# Patient Record
Sex: Female | Born: 1960 | Race: White | Hispanic: No | Marital: Single | Smoking: Never smoker
Health system: Southern US, Community
[De-identification: ages and names within clinical notes are randomized; demographics above are authoritative.]

## PROBLEM LIST (undated history)

## (undated) DIAGNOSIS — G4700 Insomnia, unspecified: Secondary | ICD-10-CM

## (undated) DIAGNOSIS — R42 Dizziness and giddiness: Secondary | ICD-10-CM

## (undated) DIAGNOSIS — K819 Cholecystitis, unspecified: Secondary | ICD-10-CM

## (undated) DIAGNOSIS — I10 Essential (primary) hypertension: Secondary | ICD-10-CM

## (undated) DIAGNOSIS — E785 Hyperlipidemia, unspecified: Secondary | ICD-10-CM

## (undated) DIAGNOSIS — I2489 Other forms of acute ischemic heart disease: Secondary | ICD-10-CM

## (undated) DIAGNOSIS — F32A Depression, unspecified: Secondary | ICD-10-CM

## (undated) DIAGNOSIS — F419 Anxiety disorder, unspecified: Secondary | ICD-10-CM

## (undated) DIAGNOSIS — J45909 Unspecified asthma, uncomplicated: Secondary | ICD-10-CM

## (undated) DIAGNOSIS — F329 Major depressive disorder, single episode, unspecified: Secondary | ICD-10-CM

## (undated) DIAGNOSIS — K219 Gastro-esophageal reflux disease without esophagitis: Secondary | ICD-10-CM

## (undated) HISTORY — DX: Hyperlipidemia, unspecified: E78.5

## (undated) HISTORY — DX: Dizziness and giddiness: R42

## (undated) HISTORY — PX: BACK SURGERY: SHX140

## (undated) HISTORY — DX: Gastro-esophageal reflux disease without esophagitis: K21.9

## (undated) HISTORY — DX: Anxiety disorder, unspecified: F41.9

## (undated) HISTORY — DX: Insomnia, unspecified: G47.00

## (undated) HISTORY — PX: ABDOMINAL HYSTERECTOMY: SHX81

## (undated) HISTORY — DX: Essential (primary) hypertension: I10

## (undated) HISTORY — DX: Major depressive disorder, single episode, unspecified: F32.9

## (undated) HISTORY — DX: Depression, unspecified: F32.A

## (undated) HISTORY — DX: Other forms of acute ischemic heart disease: I24.89

## (undated) HISTORY — DX: Cholecystitis, unspecified: K81.9

---

## 2005-03-08 ENCOUNTER — Emergency Department: Payer: Self-pay | Admitting: Emergency Medicine

## 2005-03-09 ENCOUNTER — Emergency Department: Payer: Self-pay | Admitting: Emergency Medicine

## 2005-03-10 ENCOUNTER — Emergency Department: Payer: Self-pay | Admitting: Emergency Medicine

## 2005-05-05 ENCOUNTER — Emergency Department: Payer: Self-pay | Admitting: Emergency Medicine

## 2007-02-18 ENCOUNTER — Ambulatory Visit: Payer: Self-pay | Admitting: Family Medicine

## 2007-05-26 ENCOUNTER — Ambulatory Visit: Payer: Self-pay

## 2007-06-10 ENCOUNTER — Ambulatory Visit: Payer: Self-pay | Admitting: Internal Medicine

## 2007-06-13 ENCOUNTER — Ambulatory Visit: Payer: Self-pay | Admitting: Internal Medicine

## 2007-06-13 ENCOUNTER — Emergency Department: Payer: Self-pay | Admitting: Emergency Medicine

## 2011-07-27 ENCOUNTER — Emergency Department: Payer: Self-pay | Admitting: Emergency Medicine

## 2012-04-01 ENCOUNTER — Ambulatory Visit: Payer: Self-pay | Admitting: Family Medicine

## 2015-06-20 ENCOUNTER — Ambulatory Visit (INDEPENDENT_AMBULATORY_CARE_PROVIDER_SITE_OTHER): Payer: No Typology Code available for payment source

## 2015-06-20 ENCOUNTER — Ambulatory Visit
Admission: EM | Admit: 2015-06-20 | Discharge: 2015-06-20 | Disposition: A | Discharge status: Home / Self Care | Payer: No Typology Code available for payment source | Attending: Family Medicine | Admitting: Family Medicine

## 2015-06-20 ENCOUNTER — Encounter: Payer: Self-pay | Admitting: *Deleted

## 2015-06-20 DIAGNOSIS — J40 Bronchitis, not specified as acute or chronic: Secondary | ICD-10-CM

## 2015-06-20 DIAGNOSIS — J0100 Acute maxillary sinusitis, unspecified: Secondary | ICD-10-CM | POA: Diagnosis not present

## 2015-06-20 HISTORY — DX: Unspecified asthma, uncomplicated: J45.909

## 2015-06-20 MED ORDER — IPRATROPIUM-ALBUTEROL 0.5-2.5 (3) MG/3ML IN SOLN: 3.0000 mL | Freq: Four times a day (QID) | RESPIRATORY_TRACT | Status: DC

## 2015-06-20 MED ORDER — PREDNISONE 10 MG PO TABS: ORAL_TABLET | ORAL | Status: DC

## 2015-06-20 MED ORDER — AMOXICILLIN-POT CLAVULANATE 875-125 MG PO TABS: 1.0000 | ORAL_TABLET | Freq: Two times a day (BID) | ORAL | Status: DC

## 2015-06-20 MED ORDER — PREDNISONE 50 MG PO TABS: 60.0000 mg | ORAL_TABLET | Freq: Every day | ORAL | Status: DC

## 2015-06-20 MED ORDER — GUAIFENESIN-CODEINE 100-10 MG/5ML PO SOLN
5.0000 mL | Freq: Three times a day (TID) | ORAL | Status: DC | PRN
Start: 2015-06-20 — End: 2019-01-19

## 2015-06-20 NOTE — ED Provider Notes (Signed)
[facility]  ____________________________________________  Time seen: Approximately 1840 PM  I have reviewed the triage vital signs and the nursing notes.   HISTORY   Chief Complaint Cough and Wheezing     HPI Denise Huffman is a 54 y.o. femalepresents for the complaints of 1-1.5 weeks of runny nose, nasal congestion, sinus pressure and cough. States in the last 2-3 days she's began to have some intermittent wheezing with her cough. Does report that she has a history of asthma as well as bronchitis has a history of wheezing when sick. Denies chest pain or shortness of breath. States has used home albuterol inhaler which has helped with wheezing. But states that she has continued with cough. States frequently producing thick green drainage from nose.   States current sinus pressure pain is described as a pressure aching sensation at 5 out of 10. Denies neck or back pain. Denies chest pain, shortness of breath, fever, abdominal pain, dysuria, weakness, dizziness, leg swelling, calf pain or vision changes. Reports continues to eat and drink well. Denies known sick contacts.       Past Medical History  Diagnosis Date  . Asthma    PCP: L.Miles  There are no active problems to display for this patient.   Past Surgical History  Procedure Laterality Date  . Abdominal hysterectomy    . Back surgery      Current Outpatient Rx  Name  Route  Sig  Dispense  Refill  . albuterol (PROVENTIL HFA;VENTOLIN HFA) 108 (90 BASE) MCG/ACT inhaler   Inhalation   Inhale into the lungs every 6 (six) hours as needed for wheezing or shortness of breath.         . beclomethasone (QVAR) 80 MCG/ACT inhaler   Inhalation   Inhale into the lungs 2 (two) times daily.         . sertraline (ZOLOFT) 100 MG tablet   Oral   Take 100 mg by mouth 2 (two) times daily.         . traZODone (DESYREL) 100 MG tablet   Oral   Take 100 mg by mouth at bedtime.           Allergies Review of patient's  allergies indicates no known allergies.  No family history on file.  Social History Social History  Substance Use Topics  . Smoking status: Never Smoker   . Smokeless tobacco: None  . Alcohol Use: No    Review of Systems Constitutional: No fever/chills Eyes: No visual changes. ENT: No sore throat. Positive runny nose, congestion, sinus drainage, sinus pressure and cough.  Cardiovascular: Denies chest pain. Respiratory: Denies shortness of breath.  Gastrointestinal: No abdominal pain.  No nausea, no vomiting.  No diarrhea.  No constipation. Genitourinary: Negative for dysuria. Musculoskeletal: Negative for back pain. Skin: Negative for rash. Neurological: Negative for headaches, focal weakness or numbness.  10-point ROS otherwise negative.  ____________________________________________   PHYSICAL EXAM:  VITAL SIGNS: ED Triage Vitals  Enc Vitals Group     BP 06/20/15 1811 135/79 mmHg     Pulse Rate 06/20/15 1811 73     Resp -- 20     Temp 06/20/15 1811 98 F (36.7 C)     Temp Source 06/20/15 1811 Oral     SpO2 06/20/15 1811 96 %     Weight 06/20/15 1811 200 lb (90.719 kg)     Height 06/20/15 1811  (1.676 m)     Head Cir --  Peak Flow --      Pain Score 06/20/15 1819 8     Pain Loc --      Pain Edu? --      Excl. in GC? --     Constitutional: Alert and oriented. Well appearing and in no acute distress. Eyes: Conjunctivae are normal. PERRL. EOMI. Head: Atraumatic. Mod TTP bilateral maxillary sinuses, minimal TTP bilateral frontal sinuses. No swelling, no erythema.   Ears: no erythema, normal TMs bilaterally.   Nose: nasal congestion, bilateral nasal turbinate erythema, nares patent.  Mouth/Throat: Mucous membranes are moist.  Oropharynx non-erythematous. No tonsillar swelling or exudate.  Neck: No stridor.  No cervical spine tenderness to palpation. Hematological/Lymphatic/Immunilogical: No cervical lymphadenopathy. Cardiovascular: Normal rate, regular  rhythm. Grossly normal heart sounds.  Good peripheral circulation. Respiratory: Normal respiratory effort.  No retractions. Mild scattered lower base bilateral rhonchi, scattered inspiratory wheezes present, good air movement.  Gastrointestinal: Soft and nontender. No distention. Normal Bowel sounds. No CVA tenderness. Musculoskeletal: No lower or upper extremity tenderness nor edema.  No joint effusions. Bilateral pedal pulses equal and easily palpated.  Neurologic:  Normal speech and language. No gross focal neurologic deficits are appreciated. No gait instability. Skin:  Skin is warm, dry and intact. No rash noted. Psychiatric: Mood and affect are normal. Speech and behavior are normal.  ____________________________________________   LABS (all labs ordered are listed, but only abnormal results are displayed)  Labs Reviewed - No data to display  RADIOLOGY  EXAM: CHEST - 2 VIEW  COMPARISON: None available  FINDINGS: Mild interstitial prominence. No confluent airspace infiltrate or overt edema. Heart size upper limits normal. Fifth tortuous thoracic aorta. No pneumothorax. No effusion. Visualized skeletal structures are unremarkable.  IMPRESSION: No acute cardiopulmonary disease.   Electronically Signed By: Corlis Leak Hassell M.D. On: 06/20/2015 19:18  I, Renford DillsLindsey Mailynn Everly, personally viewed and evaluated these images (plain radiographs) as part of my medical decision making.     INITIAL IMPRESSION / ASSESSMENT AND PLAN / ED COURSE  Pertinent labs & imaging results that were available during my care of the patient were reviewed by me and considered in my medical decision making (see chart for details).  Very well-appearing patient. No acute distress. Presents for the complaints of 1-1.5 weeks of runny nose, nasal congestion, sinus pressure and intermittent cough. Reports gradual onset of intermittent wheezing with cough. Denies chest pain or shortness of breath. Afebrile.  Reports continues to eat and drink well. Scattered rhonchi as well as for next 3 wheezes noted. Albuterol ipratropium nebulizer 2. Oral prednisone 60 mg orally 1 in urgent care. Will evaluate chest x-ray.  Chest x-ray negative for acute cardiopulmonary disease. Post albuterol nebulizer treatments lungs reexamined, wheezes resolved, continues with mild scattered rhonchi. Patient reports feeling much better and requests ready to go home. Will treat with oral Augmentin, prednisone taper, when necessary guaifenesin codeine, rest, fluids and close follow-up with primary care physician.  Discussed follow up with Primary care physician this week. Discussed follow up and return parameters including no resolution or any worsening concerns. Patient verbalized understanding and agreed to plan.  ____________________________________________   FINAL CLINICAL IMPRESSION(S) / ED DIAGNOSES  Final diagnoses:  Bronchitis  Acute maxillary sinusitis, recurrence not specified       Renford DillsLindsey Jaiden Wahab, NP 06/20/15 2238  Renford DillsLindsey Reznor Ferrando, NP 06/21/15 1433

## 2015-06-20 NOTE — Discharge Instructions (Signed)
Take medication as prescribed. Rest. Eat and drink regularly. Use home albuterol inhaler as needed for wheezing.  Follow-up closely with your primary care physician. Return to urgent care or proceed to emergency room as needed for shortness of breath, chest pain, new or worsening concerns.  Sinusitis, Adult Sinusitis is redness, soreness, and puffiness (inflammation) of the air pockets in the bones of your face (sinuses). The redness, soreness, and puffiness can cause air and mucus to get trapped in your sinuses. This can allow germs to grow and cause an infection.  HOME CARE   Drink enough fluids to keep your pee (urine) clear or pale yellow.  Use a humidifier in your home.  Run a hot shower to create steam in the bathroom. Sit in the bathroom with the door closed. Breathe in the steam 3-4 times a day.  Put a warm, moist washcloth on your face 3-4 times a day, or as told by your doctor.  Use salt water sprays (saline sprays) to wet the thick fluid in your nose. This can help the sinuses drain.  Only take medicine as told by your doctor. GET HELP RIGHT AWAY IF:   Your pain gets worse.  You have very bad headaches.  You are sick to your stomach (nauseous).  You throw up (vomit).  You are very sleepy (drowsy) all the time.  Your face is puffy (swollen).  Your vision changes.  You have a stiff neck.  You have trouble breathing. MAKE SURE YOU:   Understand these instructions.  Will watch your condition.  Will get help right away if you are not doing well or get worse.   This information is not intended to replace advice given to you by your health care provider. Make sure you discuss any questions you have with your health care provider.   Document Released: 01/01/2008 Document Revised: 08/05/2014 Document Reviewed: 02/18/2012 Elsevier Interactive Patient Education [employer]2016 [employer]vier Inc.

## 2015-06-20 NOTE — ED Notes (Signed)
Pt states that she has had a cough and congestion, pt states that she has a "chest cold" since about a week and a half.

## 2018-03-26 ENCOUNTER — Encounter (INDEPENDENT_AMBULATORY_CARE_PROVIDER_SITE_OTHER): Payer: BLUE CROSS/BLUE SHIELD | Admitting: Vascular Surgery

## 2018-04-01 ENCOUNTER — Ambulatory Visit (INDEPENDENT_AMBULATORY_CARE_PROVIDER_SITE_OTHER): Payer: BLUE CROSS/BLUE SHIELD | Admitting: Vascular Surgery

## 2018-04-01 ENCOUNTER — Encounter (INDEPENDENT_AMBULATORY_CARE_PROVIDER_SITE_OTHER): Payer: Self-pay | Admitting: Vascular Surgery

## 2018-04-01 VITALS — BP 158/98 | HR 69 | Resp 13 | Ht 67.0 in | Wt 187.0 lb

## 2018-04-01 DIAGNOSIS — M79605 Pain in left leg: Secondary | ICD-10-CM | POA: Diagnosis not present

## 2018-04-01 DIAGNOSIS — M79604 Pain in right leg: Secondary | ICD-10-CM

## 2018-04-01 DIAGNOSIS — R600 Localized edema: Secondary | ICD-10-CM

## 2018-04-01 NOTE — Progress Notes (Signed)
Subjective:    Patient ID: Denise Huffman, female    DOB: 04/13/1961, 57 y.o.   MRN: [id number] Chief Complaint  Patient presents with  . New Patient (Initial Visit)    Lower leg pain   Presents as a new patient referred by Dr. Marvis Moeller for evaluation of bilateral lower extremity pain and edema.  The patient endorses a long-standing history of pain which radiates down her bilateral legs for many months.  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  The patient notes that her symptoms in the left lower extremity are worse than the right.  The patient also experiences a cramping in the bilateral calves mostly in the evening.  The patient also rinses bilateral lower extremity edema.  The patient feels that her edema has progressed to the point that she is unable to function on a daily basis.  Patient notes that her symptoms are not lifestyle limiting.  At this time, the patient has not started to engage in conservative therapy including wearing medical grade 1 compression socks, elevating her legs remain active on a daily basis.  The patient denies any recent surgery or trauma to the bilateral legs.  Patient denies any DVT history.  Patient denies any recent bouts of cellulitis.  Patient denies any fever, nausea vomiting.  Review of Systems  Constitutional: Negative.   HENT: Negative.   Eyes: Negative.   Respiratory: Negative.   Cardiovascular: Positive for leg swelling.       Lower Extremity Pain  Gastrointestinal: Negative.   Endocrine: Negative.   Genitourinary: Negative.   Musculoskeletal: Negative.   Skin: Negative.   Allergic/Immunologic: Negative.   Neurological: Negative.   Hematological: Negative.   Psychiatric/Behavioral: Negative.       Objective:   Physical Exam  Constitutional: She is oriented to person, place, and time. She appears well-developed and well-nourished. No distress.  HENT:  Head: Normocephalic and atraumatic.  Right Ear: External ear  normal.  Left Ear: External ear normal.  Mouth/Throat: Oropharynx is clear and moist.  Eyes: Pupils are equal, round, and reactive to light. Conjunctivae and EOM are normal.  Neck: Normal range of motion.  Cardiovascular: Normal rate, regular rhythm, normal heart sounds and intact distal pulses.  Pulses:      Radial pulses are 2+ on the right side, and 2+ on the left side.       Dorsalis pedis pulses are 2+ on the right side, and 2+ on the left side.       Posterior tibial pulses are 2+ on the right side, and 2+ on the left side.  Palpable pedal pulses in the bilateral feet are warm  Pulmonary/Chest: Effort normal and breath sounds normal.  Musculoskeletal: Normal range of motion. She exhibits edema (Mild nonpitting edema noted bilaterally).  Neurological: She is alert and oriented to person, place, and time.  Skin: Skin is warm and dry. She is not diaphoretic.  There is no stasis dermatitis, fibrosis, cellulitis or active ulcerations noted at this time  Psychiatric: She has a normal mood and affect. Her behavior is normal. Judgment and thought content normal.  Vitals reviewed.  BP (!) 158/98 (BP Location: Right Arm, Patient Position: Sitting)   Pulse 69   Resp 13   Ht 5\' 7"  (1.702 m)   Wt 187 lb (84.8 kg)   BMI 29.29 kg/m   Past Medical History:  Diagnosis Date  . Anxiety   . Asthma   . Depression   .  GERD (gastroesophageal reflux disease)   . Hyperlipidemia   . Hypertension   . Insomnia   . Vertigo    Social History   Socioeconomic History  . Marital status: Single    Spouse name: Not on file  . Number of children: Not on file  . Years of education: Not on file  . Highest education level: Not on file  Occupational History  . Not on file  Social Needs  . Financial resource strain: Not on file  . Food insecurity:    Worry: Not on file    Inability: Not on file  . Transportation needs:    Medical: Not on file    Non-medical: Not on file  Tobacco Use  . Smoking  status: Never Smoker  . Smokeless tobacco: Never Used  Substance and Sexual Activity  . Alcohol use: No  . Drug use: No  . Sexual activity: Not on file  Lifestyle  . Physical activity:    Days per week: Not on file    Minutes per session: Not on file  . Stress: Not on file  Relationships  . Social connections:    Talks on phone: Not on file    Gets together: Not on file    Attends religious service: Not on file    Active member of club or organization: Not on file    Attends meetings of clubs or organizations: Not on file    Relationship status: Not on file  . Intimate partner violence:    Fear of current or ex partner: Not on file    Emotionally abused: Not on file    Physically abused: Not on file    Forced sexual activity: Not on file  Other Topics Concern  . Not on file  Social History Narrative  . Not on file   Past Surgical History:  Procedure Laterality Date  . ABDOMINAL HYSTERECTOMY    . BACK SURGERY     No family history on file.  No Known Allergies     Assessment & Plan:  Presents as a new patient referred by Dr. Marvis Moeller for evaluation of bilateral lower extremity pain and edema.  The patient endorses a long-standing history of pain which radiates down her bilateral legs for many months.  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  The patient notes that her symptoms in the left lower extremity are worse than the right.  The patient also experiences a cramping in the bilateral calves mostly in the evening.  The patient also rinses bilateral lower extremity edema.  The patient feels that her edema has progressed to the point that she is unable to function on a daily basis.  Patient notes that her symptoms are not lifestyle limiting.  At this time, the patient has not started to engage in conservative therapy including wearing medical grade 1 compression socks, elevating her legs remain active on a daily basis.  The patient denies any  recent surgery or trauma to the bilateral legs.  Patient denies any DVT history.  Patient denies any recent bouts of cellulitis.  Patient denies any fever, nausea vomiting.  1. Pain in both lower extremities - New Patient with multiple risk factors for peripheral artery disease She does have palpable pulses on exam however her discomfort has progressed to the point that she is unable to function on a daily basis. I have a strong suspicion the patient's pain is spinal degenerative joint disease in origin however  I will bring her back to rule out any peripheral artery disease with an ABI I have discussed with the patient at length the risk factors for and pathogenesis of atherosclerotic disease and encouraged a healthy diet, regular exercise regimen and blood pressure / glucose control.  The patient was encouraged to call the office in the interim if he experiences any claudication like symptoms, rest pain or ulcers to his feet / toes.  - VAS [place] ABI WITH/WO TBI; Future  2. Bilateral lower extremity edema - New The patient was encouraged to wear graduated compression stockings (20-30 mmHg) on a daily basis. The patient was instructed to begin wearing the stockings first thing in the morning and removing them in the evening. The patient was instructed specifically not to sleep in the stockings. Prescription given.  In addition, behavioral modification including elevation during the day will be initiated. Anti-inflammatories for pain. Bring patient back and have her undergo bilateral lower extremity venous duplex to rule out any contributing venous versus lymphatic disease She is to follow-up at her convenience Information on compression stockings was given to the patient. The patient was instructed to call the office in the interim if any worsening edema or ulcerations to the legs, feet or toes occurs. The patient expresses their understanding.  - VAS [place] LOWER EXTREMITY VENOUS REFLUX;  Future  Current Outpatient Medications on File Prior to Visit  Medication Sig Dispense Refill  . albuterol (PROVENTIL HFA;VENTOLIN HFA) 108 (90 BASE) MCG/ACT inhaler Inhale into the lungs every 6 (six) hours as needed for wheezing or shortness of breath.    . beclomethasone (QVAR) 80 MCG/ACT inhaler Inhale into the lungs 2 (two) times daily.    . cetirizine (ZYRTEC) 10 MG tablet Take 10 mg by mouth daily.    Marland Kitchen dicyclomine (BENTYL) 20 MG tablet Take 20 mg by mouth every 6 (six) hours.    Marland Kitchen guaiFENesin-codeine 100-10 MG/5ML syrup Take 5 mLs by mouth 3 (three) times daily as needed for cough. 75 mL 0  . hydrOXYzine (ATARAX/VISTARIL) 25 MG tablet Take 25 mg by mouth 3 (three) times daily as needed.    Marland Kitchen ipratropium (ATROVENT) 0.06 % nasal spray Place 2 sprays into both nostrils 4 (four) times daily.    Marland Kitchen lisinopril-hydrochlorothiazide (PRINZIDE,ZESTORETIC) 20-25 MG tablet   5  . omeprazole (PRILOSEC) 20 MG capsule Take 20 mg by mouth daily.    . predniSONE (DELTASONE) 10 MG tablet Start 60 mg po day one, then 50 mg po day two, taper by 10 mg daily until complete. 21 tablet 0  . sertraline (ZOLOFT) 100 MG tablet Take 100 mg by mouth 2 (two) times daily.    . tamsulosin (FLOMAX) 0.4 MG CAPS capsule Take 0.4 mg by mouth.    . traMADol (ULTRAM) 50 MG tablet   2  . traZODone (DESYREL) 100 MG tablet Take 100 mg by mouth at bedtime.     No current facility-administered medications on file prior to visit.    There are no Patient Instructions on file for this visit. No follow-ups on file.  Nathen Balaban A Ayomide Zuleta, PA-C

## 2018-04-15 ENCOUNTER — Other Ambulatory Visit: Payer: Self-pay | Admitting: Family Medicine

## 2018-04-15 DIAGNOSIS — Z1231 Encounter for screening mammogram for malignant neoplasm of breast: Secondary | ICD-10-CM

## 2018-04-17 ENCOUNTER — Encounter (INDEPENDENT_AMBULATORY_CARE_PROVIDER_SITE_OTHER): Payer: Self-pay | Admitting: Vascular Surgery

## 2018-04-17 ENCOUNTER — Encounter (INDEPENDENT_AMBULATORY_CARE_PROVIDER_SITE_OTHER): Payer: Self-pay

## 2018-04-17 ENCOUNTER — Ambulatory Visit (INDEPENDENT_AMBULATORY_CARE_PROVIDER_SITE_OTHER): Payer: BLUE CROSS/BLUE SHIELD

## 2018-04-17 ENCOUNTER — Ambulatory Visit (INDEPENDENT_AMBULATORY_CARE_PROVIDER_SITE_OTHER): Payer: BLUE CROSS/BLUE SHIELD | Admitting: Vascular Surgery

## 2018-04-17 VITALS — BP 163/83 | HR 56 | Resp 14 | Ht 67.0 in | Wt 191.0 lb

## 2018-04-17 DIAGNOSIS — M79605 Pain in left leg: Secondary | ICD-10-CM

## 2018-04-17 DIAGNOSIS — I10 Essential (primary) hypertension: Secondary | ICD-10-CM | POA: Diagnosis not present

## 2018-04-17 DIAGNOSIS — M79604 Pain in right leg: Secondary | ICD-10-CM | POA: Diagnosis not present

## 2018-04-17 DIAGNOSIS — R600 Localized edema: Secondary | ICD-10-CM

## 2018-04-17 NOTE — Progress Notes (Signed)
MRN : [id number]  Denise Huffman is a 57 y.o. (24-Nov-1960) female who presents with chief complaint of  Chief Complaint  Patient presents with  . Follow-up    ultrasound  .  History of Present Illness: Patient returns in follow up for leg pain.  Her legs are basically about the same as they were at her initial visit.  No major changes.  No new ulceration or infection.  Noninvasive studies today demonstrate minimal common femoral venous reflux which is not clinically significant, no superficial venous reflux, no DVT or superficial thrombophlebitis, and normal arterial waveforms distally consistent with no arterial insufficiency.    Past Medical History:  Diagnosis Date  . Anxiety   . Asthma   . Depression   . GERD (gastroesophageal reflux disease)   . Hyperlipidemia   . Hypertension   . Insomnia   . Vertigo     Past Surgical History:  Procedure Laterality Date  . ABDOMINAL HYSTERECTOMY    . BACK SURGERY      Social History Social History   Tobacco Use  . Smoking status: Never Smoker  . Smokeless tobacco: Never Used  Substance Use Topics  . Alcohol use: No  . Drug use: No    Family History No bleeding or clotting disorders  Current Outpatient Medications  Medication Sig Dispense Refill  . albuterol (PROVENTIL HFA;VENTOLIN HFA) 108 (90 BASE) MCG/ACT inhaler Inhale into the lungs every 6 (six) hours as needed for wheezing or shortness of breath.    . beclomethasone (QVAR) 80 MCG/ACT inhaler Inhale into the lungs 2 (two) times daily.    . cetirizine (ZYRTEC) 10 MG tablet Take 10 mg by mouth daily.    Marland Kitchen dicyclomine (BENTYL) 20 MG tablet Take 20 mg by mouth every 6 (six) hours.    Marland Kitchen guaiFENesin-codeine 100-10 MG/5ML syrup Take 5 mLs by mouth 3 (three) times daily as needed for cough. 75 mL 0  . ipratropium (ATROVENT) 0.06 % nasal spray Place 2 sprays into both nostrils 4 (four) times daily.    Marland Kitchen lisinopril-hydrochlorothiazide (PRINZIDE,ZESTORETIC) 20-25 MG tablet   5  .  omeprazole (PRILOSEC) 20 MG capsule Take 20 mg by mouth daily.    . sertraline (ZOLOFT) 100 MG tablet Take 100 mg by mouth 2 (two) times daily.    . traMADol (ULTRAM) 50 MG tablet   2  . traZODone (DESYREL) 100 MG tablet Take 100 mg by mouth at bedtime.    . hydrOXYzine (ATARAX/VISTARIL) 25 MG tablet Take 25 mg by mouth 3 (three) times daily as needed.    . predniSONE (DELTASONE) 10 MG tablet Start 60 mg po day one, then 50 mg po day two, taper by 10 mg daily until complete. (Patient not taking: Reported on 04/17/2018) 21 tablet 0  . tamsulosin (FLOMAX) 0.4 MG CAPS capsule Take 0.4 mg by mouth.     No current facility-administered medications for this visit.     No Known Allergies   REVIEW OF SYSTEMS (Negative unless checked)  Constitutional: [] Weight loss  [] Fever  [] Chills Cardiac: [] Chest pain   [] Chest pressure   [] Palpitations   [] Shortness of breath when laying flat   [] Shortness of breath at rest   [] Shortness of breath with exertion. Vascular:  [x] Pain in legs with walking   [x] Pain in legs at rest   [] Pain in legs when laying flat   [] Claudication   [] Pain in feet when walking  [] Pain in feet at rest  [] Pain in feet when laying  flat   [] History of DVT   [] Phlebitis   [x] Swelling in legs   [] Varicose veins   [] Non-healing ulcers Pulmonary:   [] Uses home oxygen   [] Productive cough   [] Hemoptysis   [] Wheeze  [] COPD    Neurologic:  [] Dizziness  [] Blackouts   [] Seizures   [] History of stroke   [] History of TIA  [] Aphasia   [] Temporary blindness   [] Dysphagia   [] Weakness or numbness in arms   [] Weakness or numbness in legs Musculoskeletal:  [] Arthritis   [] Joint swelling   [] Joint pain   [x] Low back pain Hematologic:  [] Easy bruising  [] Easy bleeding   [] Hypercoagulable state   [] Anemic  [] Thrombocytopenia Gastrointestinal:  [] Blood in stool   [] Vomiting blood  [] Gastroesophageal reflux/heartburn   [] Difficulty swallowing. Genitourinary:  [] Chronic kidney disease   [] Difficult urination   [] Frequent urination  [] Burning with urination   [] Blood in urine Skin:  [] Rashes   [] Ulcers   [] Wounds Psychological:  [] History of anxiety   []  History of major depression.  Physical Examination  Vitals:   04/17/18 0921  BP: (!) 163/83  Pulse: (!) 56  Resp: 14  Weight: 191 lb (86.6 kg)  Height: 5\' 7"  (1.702 m)   Body mass index is 29.91 kg/m. Gen:  WD/WN, NAD, appears younger than stated age Head: [place]/AT, No temporalis wasting. Ear/Nose/Throat: Hearing grossly intact, dentition good, trachea midline Eyes: Conjunctiva clear. Sclera non-icteric Neck: Supple. Trachea midline Pulmonary:  Good air movement, respirations not labored, no use of accessory muscles.  Cardiac: RRR, no JVD Vascular:  Vessel Right Left  Radial Palpable Palpable                                   Musculoskeletal: M/S 5/5 throughout.  No deformity or atrophy. Trace edema. Neurologic: Sensation grossly intact in extremities.  Symmetrical.  Speech is fluent. Psychiatric: Judgment intact, Mood & affect appropriate for pt's clinical situation. Dermatologic: No rashes or ulcers noted.  No cellulitis or open wounds.      Labs No results found for this or any previous visit (from the past 2160 hour(s)).  Radiology No results found.   Assessment/Plan  Hypertension blood pressure control important in reducing the progression of atherosclerotic disease. On appropriate oral medications.   Bilateral lower extremity edema Stable.  No significant reflux.  Recommend compression and elevation.   Pain in both lower extremities No significant arterial or venous disease seen.  Likely neuropathic or musculoskeletal pain.  RTC PRN.    Festus BarrenJason Dew, MD  04/17/2018 12:03 PM    This note was created with Dragon medical transcription system.  Any errors from dictation are purely unintentional

## 2018-04-17 NOTE — Assessment & Plan Note (Signed)
No significant arterial or venous disease seen.  Likely neuropathic or musculoskeletal pain.  RTC PRN.

## 2018-04-17 NOTE — Assessment & Plan Note (Signed)
Stable.  No significant reflux.  Recommend compression and elevation.

## 2018-04-17 NOTE — Assessment & Plan Note (Signed)
blood pressure control important in reducing the progression of atherosclerotic disease. On appropriate oral medications.  

## 2019-01-19 ENCOUNTER — Encounter: Payer: Self-pay | Admitting: Emergency Medicine

## 2019-01-19 ENCOUNTER — Other Ambulatory Visit: Payer: Self-pay

## 2019-01-19 ENCOUNTER — Ambulatory Visit
Admission: EM | Admit: 2019-01-19 | Discharge: 2019-01-19 | Disposition: A | Discharge status: Home / Self Care | Payer: BLUE CROSS/BLUE SHIELD | Attending: Family Medicine | Admitting: Family Medicine

## 2019-01-19 DIAGNOSIS — S0502XA Injury of conjunctiva and corneal abrasion without foreign body, left eye, initial encounter: Secondary | ICD-10-CM | POA: Diagnosis not present

## 2019-01-19 DIAGNOSIS — R21 Rash and other nonspecific skin eruption: Secondary | ICD-10-CM | POA: Diagnosis not present

## 2019-01-19 DIAGNOSIS — W6132XA Struck by chicken, initial encounter: Secondary | ICD-10-CM | POA: Diagnosis not present

## 2019-01-19 MED ORDER — KETOROLAC TROMETHAMINE 0.5 % OP SOLN: 1.0000 [drp] | Freq: Four times a day (QID) | OPHTHALMIC | 0 refills | Status: DC | PRN

## 2019-01-19 MED ORDER — TRIAMCINOLONE ACETONIDE 0.5 % EX OINT: 1.0000 "application " | TOPICAL_OINTMENT | Freq: Two times a day (BID) | CUTANEOUS | 0 refills | Status: DC

## 2019-01-19 MED ORDER — CIPROFLOXACIN HCL 0.3 % OP SOLN: 1.0000 [drp] | Freq: Four times a day (QID) | OPHTHALMIC | 0 refills | Status: DC

## 2019-01-19 NOTE — ED Triage Notes (Signed)
Patient states she was pecked in the left eye by a baby chicken yesterday. She is c/o eye pain and clear watery drainage.

## 2019-01-19 NOTE — ED Provider Notes (Signed)
[facility]    CSN: [id number] Arrival date & time: 01/19/19  1033   History   Chief Complaint Chief Complaint  Patient presents with  . Eye Pain    HPI  58 year old female presents with the above complaint.  Patient reports eye injury yesterday. Was pecked in the left eye by a chicken.  Reports pain is 7/10 in severity.  Mild redness.  Watery drainage.  No visual disturbance/vision changes.  No medications or interventions tried.  No other associated symptoms.  Additionally, patient reports a rash on her chest. Raised. Red. Mild itching. She has been using cortisone without relief.  No other complaints or concerns at this time.  Hx reviewed and updated as below. Past Medical History:  Diagnosis Date  . Anxiety   . Asthma   . Depression   . GERD (gastroesophageal reflux disease)   . Hyperlipidemia   . Hypertension   . Insomnia   . Vertigo    Patient Active Problem List   Diagnosis Date Noted  . Hypertension 04/17/2018  . Pain in both lower extremities 04/01/2018  . Bilateral lower extremity edema 04/01/2018   Past Surgical History:  Procedure Laterality Date  . ABDOMINAL HYSTERECTOMY    . BACK SURGERY     OB History   No obstetric history on file.    Home Medications    Prior to Admission medications   Medication Sig Start Date End Date Taking? Authorizing Provider  albuterol (PROVENTIL HFA;VENTOLIN HFA) 108 (90 BASE) MCG/ACT inhaler Inhale into the lungs every 6 (six) hours as needed for wheezing or shortness of breath.   Yes [provider]  beclomethasone (QVAR) 80 MCG/ACT inhaler Inhale into the lungs 2 (two) times daily.   Yes [provider]  cetirizine (ZYRTEC) 10 MG tablet Take 10 mg by mouth daily.   Yes [provider]  dicyclomine (BENTYL) 20 MG tablet Take 20 mg by mouth every 6 (six) hours.   Yes [provider]  ipratropium (ATROVENT) 0.06 % nasal spray Place 2 sprays into both nostrils 4 (four) times  daily.   Yes [provider]  lisinopril-hydrochlorothiazide (PRINZIDE,ZESTORETIC) 20-25 MG tablet  02/18/18  Yes [provider]  omeprazole (PRILOSEC) 20 MG capsule Take 20 mg by mouth daily.   Yes [provider]  sertraline (ZOLOFT) 100 MG tablet Take 100 mg by mouth 2 (two) times daily.   Yes [provider]  traMADol Janean Sark(ULTRAM) 50 MG tablet  03/27/18  Yes [provider]  traZODone (DESYREL) 100 MG tablet Take 100 mg by mouth at bedtime.   Yes [provider]  ciprofloxacin (CILOXAN) 0.3 % ophthalmic solution Place 1 drop into the left eye every 6 (six) hours. For 7 days. 01/19/19   Tommie Samsook, Jaclene Bartelt G, DO  ketorolac (ACULAR) 0.5 % ophthalmic solution Place 1 drop into both eyes every 6 (six) hours as needed (Pain). For 5 days. 01/19/19   Tommie Samsook, Aubreigh Fuerte G, DO  triamcinolone ointment (KENALOG) 0.5 % Apply 1 application topically 2 (two) times daily. 01/19/19   Tommie Samsook, Savannha Welle G, DO   Social History Social History   Tobacco Use  . Smoking status: Never Smoker  . Smokeless tobacco: Never Used  Substance Use Topics  . Alcohol use: No  . Drug use: No     Allergies   Patient has no known allergies.   Review of Systems Review of Systems  Constitutional: Negative.   Eyes: Positive for pain, discharge and redness. Negative for  visual disturbance.   Physical Exam Triage Vital Signs ED Triage Vitals  Enc Vitals Group     BP 01/19/19 1049 (!) 155/81     Pulse Rate 01/19/19 1049 75     Resp 01/19/19 1049 18     Temp 01/19/19 1049 98.5 F (36.9 C)     Temp Source 01/19/19 1049 Oral     SpO2 01/19/19 1049 97 %     Weight 01/19/19 1048 180 lb (81.6 kg)     Height 01/19/19 1048 5\' 7"  (1.702 m)     Head Circumference --      Peak Flow --      Pain Score 01/19/19 1048 7     Pain Loc --      Pain Edu? --      Excl. in [place]? --     Updated Vital Signs BP (!) 155/81 (BP Location: Right Arm)   Pulse 75   Temp 98.5 F (36.9 C) (Oral)   Resp 18    Ht 5\' 7"  (1.702 m)   Wt 81.6 kg   SpO2 97%   BMI 28.19 kg/m   Visual Acuity Right Eye Distance:   Left Eye Distance:   Bilateral Distance:    Right Eye Near:   Left Eye Near:    Bilateral Near:     Physical Exam Vitals signs and nursing note reviewed.  Constitutional:      General: She is not in acute distress.    Appearance: Normal appearance.  HENT:     Head: Normocephalic and atraumatic.  Eyes:      Comments: Left eye with mild conjunctival injection.  Fluorescein uptake noted at the labeled location.  Pulmonary:     Effort: Pulmonary effort is normal. No respiratory distress.  Neurological:     Mental Status: She is alert.  Psychiatric:        Mood and Affect: Mood normal.        Behavior: Behavior normal.    UC Treatments / Results  Labs (all labs ordered are listed, but only abnormal results are displayed) Labs Reviewed - No data to display  EKG None  Radiology No results found.  Procedures Procedures (including critical care time)  Medications Ordered in UC Medications - No data to display  Initial Impression / Assessment and Plan / UC Course  I have reviewed the triage vital signs and the nursing notes.  Pertinent labs & imaging results that were available during my care of the patient were reviewed by me and considered in my medical decision making (see chart for details).    58 year old female presents with a corneal abrasion.  Also has a rash.  Treating with Cipro and ketorolac eyedrops.  Triamcinolone as directed.  Final Clinical Impressions(s) / UC Diagnoses   Final diagnoses:  Abrasion of left cornea, initial encounter  Rash   Discharge Instructions   None    ED Prescriptions    Medication Sig Dispense Auth. Provider   ciprofloxacin (CILOXAN) 0.3 % ophthalmic solution Place 1 drop into the left eye every 6 (six) hours. For 7 days. 10 mL Suzanna Zahn G, DO   ketorolac (ACULAR) 0.5 % ophthalmic solution Place 1 drop into both eyes  every 6 (six) hours as needed (Pain). For 5 days. 10 mL Randale Carvalho G, DO   triamcinolone ointment (KENALOG) 0.5 % Apply 1 application topically 2 (two) times daily. 30 g Coral Spikes, DO     Controlled Substance Prescriptions [place]  Controlled Substance Registry consulted? Not Applicable   Tommie SamsCook, Muhammadali Ries G, DO 01/19/19 1234

## 2019-11-09 ENCOUNTER — Other Ambulatory Visit: Payer: Self-pay

## 2019-11-09 ENCOUNTER — Ambulatory Visit
Admission: RE | Admit: 2019-11-09 | Discharge: 2019-11-09 | Disposition: A | Discharge status: Home / Self Care | Payer: BLUE CROSS/BLUE SHIELD | Source: Ambulatory Visit | Attending: Family Medicine | Admitting: Family Medicine

## 2019-11-09 ENCOUNTER — Other Ambulatory Visit: Payer: Self-pay | Admitting: Family Medicine

## 2019-11-09 ENCOUNTER — Ambulatory Visit
Admission: RE | Admit: 2019-11-09 | Discharge: 2019-11-09 | Disposition: A | Discharge status: Home / Self Care | Payer: BLUE CROSS/BLUE SHIELD | Attending: Family Medicine | Admitting: Family Medicine

## 2019-11-09 DIAGNOSIS — M25561 Pain in right knee: Secondary | ICD-10-CM | POA: Diagnosis present

## 2019-12-11 ENCOUNTER — Ambulatory Visit
Admission: EM | Admit: 2019-12-11 | Discharge: 2019-12-11 | Disposition: A | Discharge status: Home / Self Care | Payer: BLUE CROSS/BLUE SHIELD | Attending: Family Medicine | Admitting: Family Medicine

## 2019-12-11 ENCOUNTER — Other Ambulatory Visit: Payer: Self-pay

## 2019-12-11 ENCOUNTER — Encounter: Payer: Self-pay | Admitting: Emergency Medicine

## 2019-12-11 DIAGNOSIS — J020 Streptococcal pharyngitis: Secondary | ICD-10-CM | POA: Diagnosis not present

## 2019-12-11 LAB — GROUP A STREP BY PCR: Group A Strep by PCR: DETECTED — AB

## 2019-12-11 MED ORDER — AMOXICILLIN 875 MG PO TABS: 875.0000 mg | ORAL_TABLET | Freq: Two times a day (BID) | ORAL | 0 refills | Status: DC

## 2019-12-11 MED ORDER — DEXAMETHASONE SODIUM PHOSPHATE 10 MG/ML IJ SOLN
10.0000 mg | Freq: Once | INTRAMUSCULAR | Status: AC
  Administered 2019-12-11: 10 mg via INTRAMUSCULAR

## 2019-12-11 NOTE — ED Triage Notes (Signed)
Patient c/o sore throat for the past 2 days.  Patient unsure if she has had a fever.

## 2019-12-11 NOTE — ED Provider Notes (Signed)
[facility] ____________________________________________  Time seen: Approximately 9:52 AM  I have reviewed the triage vital signs and the nursing notes.   HISTORY  Chief Complaint Sore Throat   HPI Denise Huffman is a 59 y.o. female presenting for evaluation of 2 days of sore throat.  States has some accompanying nasal congestion but predominantly sore throat.  States sore throat feels like she is having swelling in her neck going towards her ears causing pressure.  Reports has been drinking some fluids but it hurts and painful to swallow.  Not eating as much.  Denies accompanying fevers.  Did take some ibuprofen.  Denies cough, chest pain, shortness of breath, rash.  Reports her daughter currently has strep throat.  Reports otherwise doing well.  Leanna Sato, MD : PCP    Past Medical History:  Diagnosis Date  . Anxiety   . Asthma   . Depression   . GERD (gastroesophageal reflux disease)   . Hyperlipidemia   . Hypertension   . Insomnia   . Vertigo     Patient Active Problem List   Diagnosis Date Noted  . Hypertension 04/17/2018  . Pain in both lower extremities 04/01/2018  . Bilateral lower extremity edema 04/01/2018    Past Surgical History:  Procedure Laterality Date  . ABDOMINAL HYSTERECTOMY    . BACK SURGERY       No current facility-administered medications for this encounter.  Current Outpatient Medications:  .  beclomethasone (QVAR) 80 MCG/ACT inhaler, Inhale into the lungs 2 (two) times daily., Disp: , Rfl:  .  cetirizine (ZYRTEC) 10 MG tablet, Take 10 mg by mouth daily., Disp: , Rfl:  .  dicyclomine (BENTYL) 20 MG tablet, Take 20 mg by mouth every 6 (six) hours., Disp: , Rfl:  .  lisinopril-hydrochlorothiazide (PRINZIDE,ZESTORETIC) 20-25 MG tablet, , Disp: , Rfl: 5 .  omeprazole (PRILOSEC) 20 MG capsule, Take 20 mg by mouth daily., Disp: , Rfl:  .  sertraline (ZOLOFT) 100 MG tablet, Take 100 mg by mouth 2 (two) times daily., Disp: , Rfl:   .  traMADol (ULTRAM) 50 MG tablet, , Disp: , Rfl: 2 .  traZODone (DESYREL) 100 MG tablet, Take 100 mg by mouth at bedtime., Disp: , Rfl:  .  albuterol (PROVENTIL HFA;VENTOLIN HFA) 108 (90 BASE) MCG/ACT inhaler, Inhale into the lungs every 6 (six) hours as needed for wheezing or shortness of breath., Disp: , Rfl:  .  amoxicillin (AMOXIL) 875 MG tablet, Take 1 tablet (875 mg total) by mouth 2 (two) times daily., Disp: 20 tablet, Rfl: 0 .  triamcinolone ointment (KENALOG) 0.5 %, Apply 1 application topically 2 (two) times daily., Disp: 30 g, Rfl: 0  Allergies Patient has no known allergies.  History reviewed. No pertinent family history.  Social History Social History   Tobacco Use  . Smoking status: Never Smoker  . Smokeless tobacco: Never Used  Substance Use Topics  . Alcohol use: No  . Drug use: No    Review of Systems Constitutional: No fever ENT: Positive sore throat Cardiovascular: Denies chest pain. Respiratory: Denies shortness of breath. Gastrointestinal: No abdominal pain.  No nausea, no vomiting.  No diarrhea. Musculoskeletal: Negative for back pain. Skin: Negative for rash.   ____________________________________________   PHYSICAL EXAM:  VITAL SIGNS: ED Triage Vitals  Enc Vitals Group     BP 12/11/19 0935 (!) 147/78     Pulse Rate 12/11/19 0935 83     Resp 12/11/19 0935 14  Temp 12/11/19 0935 98 F (36.7 C)     Temp Source 12/11/19 0935 Oral     SpO2 12/11/19 0935 95 %     Weight 12/11/19 0931 190 lb (86.2 kg)     Height 12/11/19 0931 5\' 5"  (1.651 m)     Head Circumference --      Peak Flow --      Pain Score 12/11/19 0931 10     Pain Loc --      Pain Edu? --      Excl. in [place]? --     Constitutional: Alert and oriented. Well appearing and in no acute distress. Eyes: Conjunctivae are normal.  Head: Atraumatic. No sinus tenderness to palpation. No swelling. No erythema.  Ears: no erythema, normal TMs bilaterally.   Nose: Mild nasal congestion.   Mouth/Throat: Mucous membranes are balendura.com to moderate pharyngeal erythema.  2+ bilateral tonsillar swelling.  Minimal bilateral exudate.  No uvular shift or deviation. Neck: No stridor.  No cervical spine tenderness to palpation. Hematological/Lymphatic/Immunilogical: Bilateral anterior cervical lymphadenopathy. Cardiovascular: Normal rate, regular rhythm. Grossly normal heart sounds.  Good peripheral circulation. Respiratory: Normal respiratory effort.  No retractions. No wheezes, rales or rhonchi. Good air movement.  Musculoskeletal: Ambulatory with steady gait. Neurologic:  Normal speech and language. No gait instability. Skin:  Skin appears warm, dry and intact. No rash noted. Psychiatric: Mood and affect are normal. Speech and behavior are normal.  ___________________________________________   LABS (all labs ordered are listed, but only abnormal results are displayed)  Labs Reviewed  GROUP A STREP BY PCR - Abnormal; Notable for the following components:      Result Value   Group A Strep by PCR DETECTED (*)    All other components within normal limits   ____________________________________________  PROCEDURES Procedures    INITIAL IMPRESSION / ASSESSMENT AND PLAN / ED COURSE  Pertinent labs & imaging results that were available during my care of the patient were reviewed by me and considered in my medical decision making (see chart for details).  Well-appearing patient.  No acute distress.  Strep positive.  10 mg IM Decadron given once in urgent care.  Will treat with oral amoxicillin.  Encourage rest, fluids, supportive care, gargles. Discussed indication, risks and benefits of medications with patient.  Discussed follow up and return parameters including no resolution or any worsening concerns. Patient verbalized understanding and agreed to plan.   ____________________________________________   FINAL CLINICAL IMPRESSION(S) / ED DIAGNOSES  Final diagnoses:  Strep  pharyngitis     ED Discharge Orders         Ordered    amoxicillin (AMOXIL) 875 MG tablet  2 times daily     12/11/19 1007           Note: This dictation was prepared with Dragon dictation along with smaller phrase technology. Any transcriptional errors that result from this process are unintentional.         Marylene Land, NP 12/11/19 1107

## 2019-12-11 NOTE — Discharge Instructions (Addendum)
Take medication as prescribed. Rest. Drink plenty of fluids.  ° °Follow up with your primary care physician this week as needed. Return to Urgent care for new or worsening concerns.  ° °

## 2020-04-12 ENCOUNTER — Telehealth: Payer: Self-pay | Admitting: Pulmonary Disease

## 2020-04-12 NOTE — Telephone Encounter (Signed)
Chart entered in error

## 2020-04-24 ENCOUNTER — Other Ambulatory Visit: Payer: Self-pay | Admitting: Family Medicine

## 2020-04-24 DIAGNOSIS — Z1231 Encounter for screening mammogram for malignant neoplasm of breast: Secondary | ICD-10-CM

## 2021-09-24 IMAGING — CR DG KNEE COMPLETE 4+V*R*
4 series · 5 of 5 positions shown · non-contrast
Comparison: None.

CLINICAL DATA: Knee pain

EXAM:
RIGHT KNEE - COMPLETE 4+ VIEW

[knee ap]
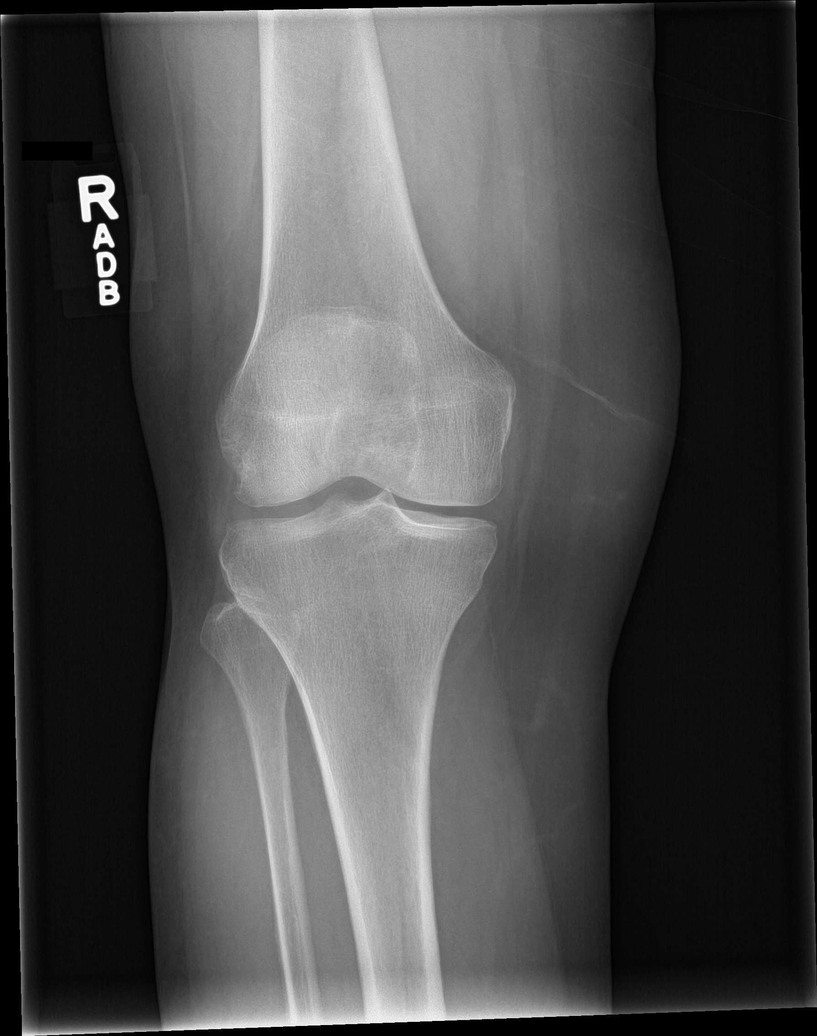

[knee lat]
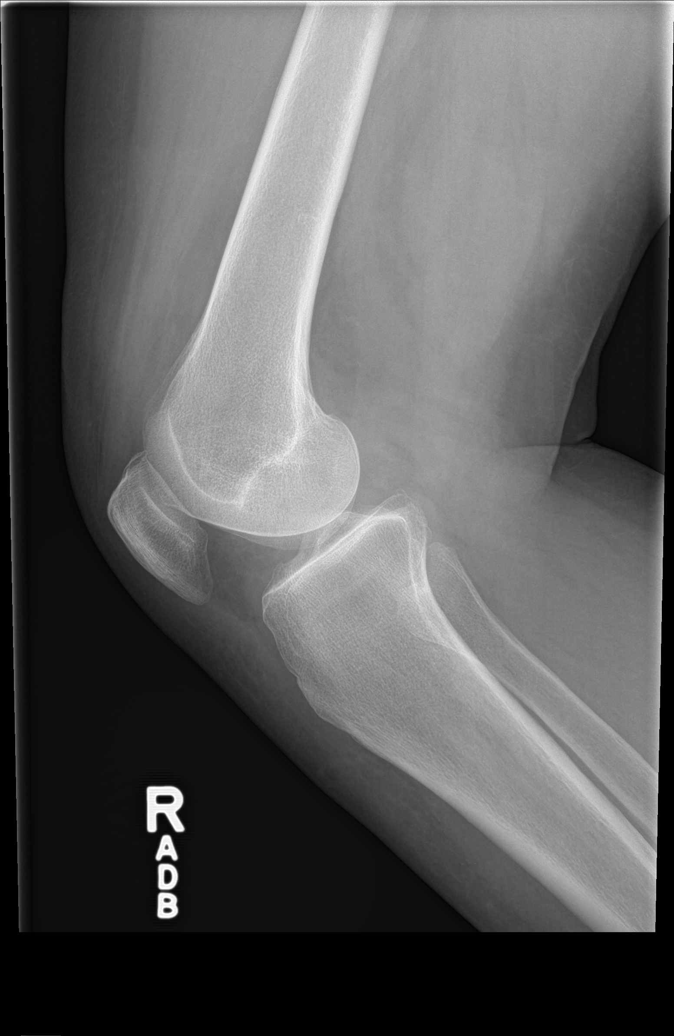

[tunnel]
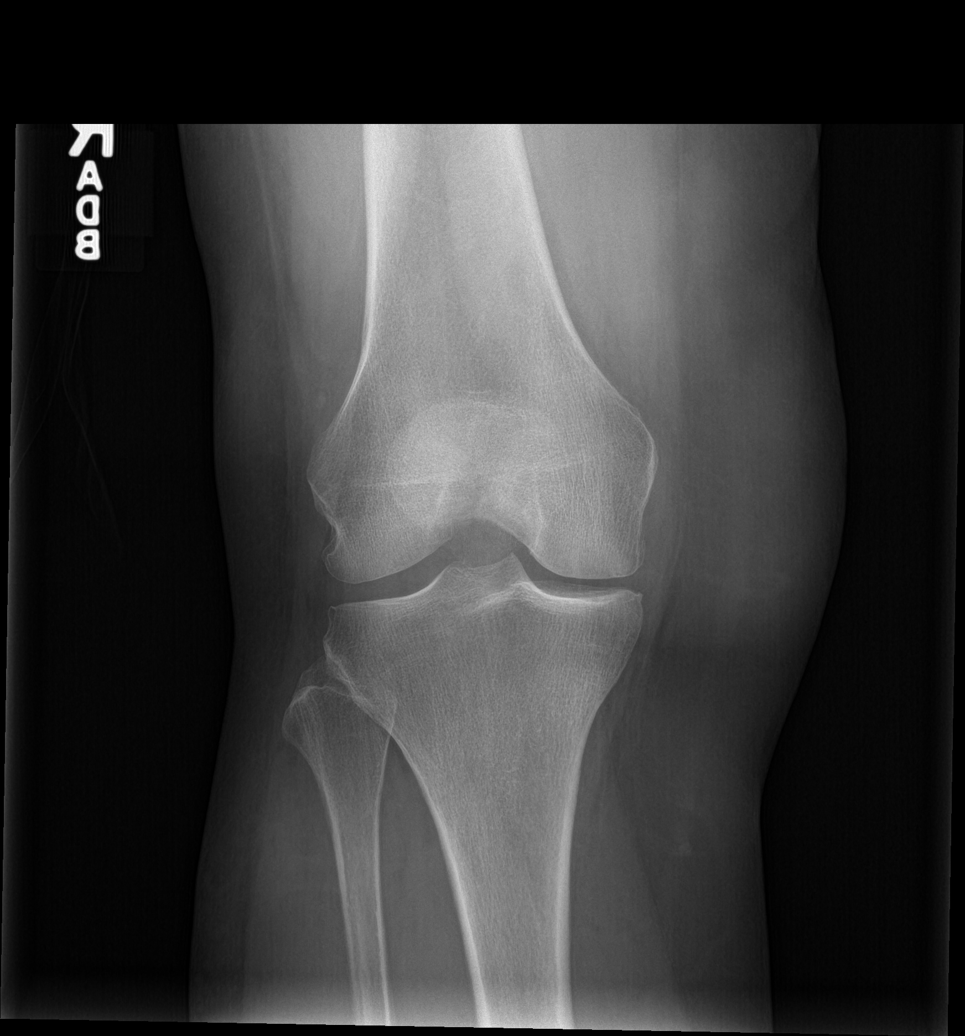

[Series 4: patella skyline · 0.14mm/px · 2 of 2 slices shown]
[im 1/2]
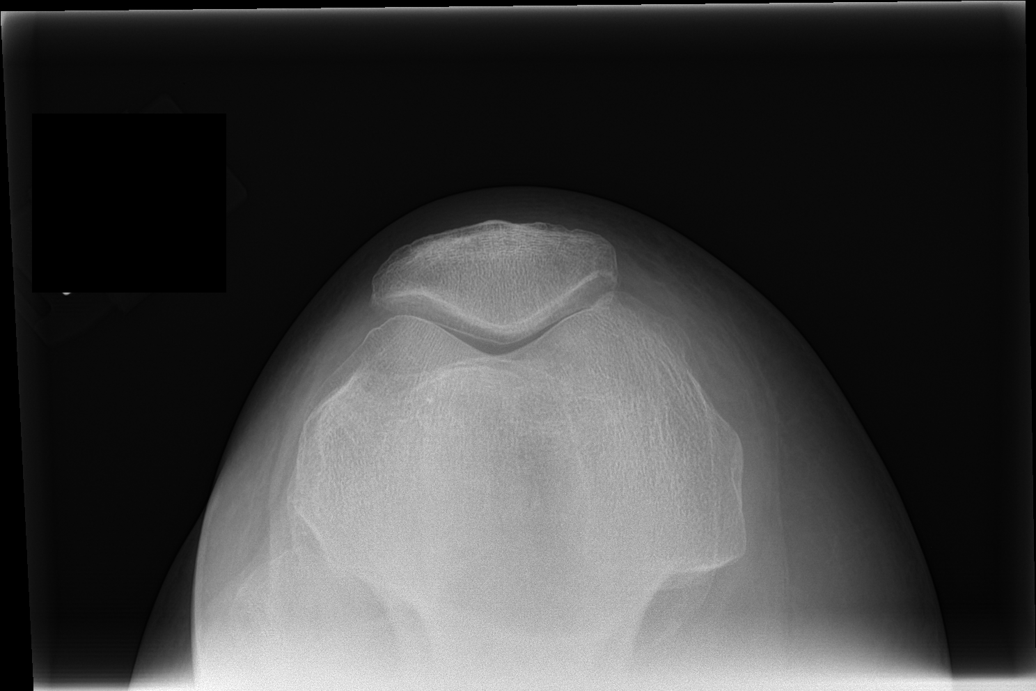
[im 2/2]
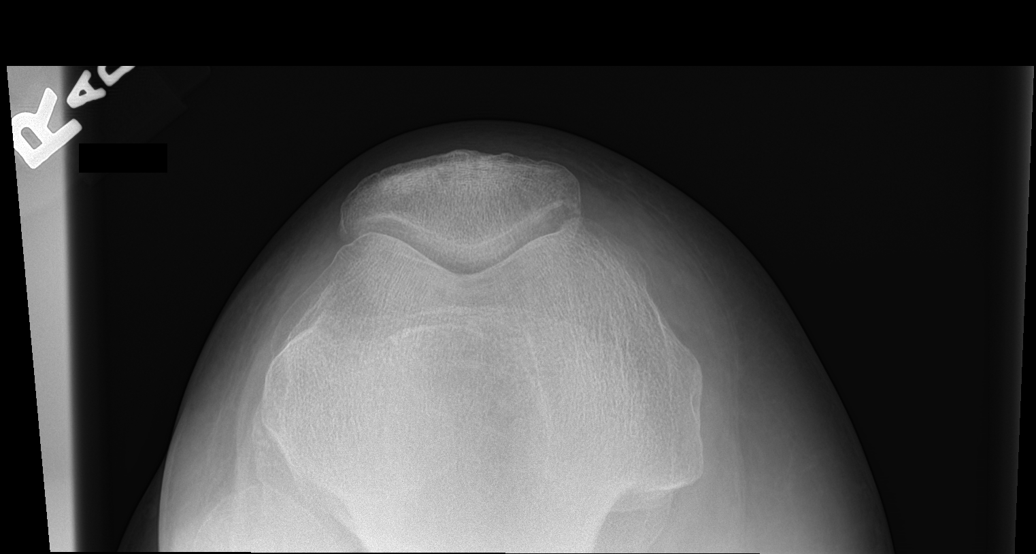

[5 of 5 positions shown; findings below may reference images not displayed]

FINDINGS: No fracture or malalignment. Mild tricompartment arthritis. Trace
knee effusion
IMPRESSION: Mild arthritis with trace knee effusion

## 2021-11-21 ENCOUNTER — Ambulatory Visit
Admission: EM | Admit: 2021-11-21 | Discharge: 2021-11-21 | Disposition: A | Discharge status: Home / Self Care | Payer: BLUE CROSS/BLUE SHIELD | Attending: Emergency Medicine | Admitting: Emergency Medicine

## 2021-11-21 DIAGNOSIS — R079 Chest pain, unspecified: Secondary | ICD-10-CM

## 2021-11-21 NOTE — Discharge Instructions (Addendum)
I am concerned that this could be your heart.  Please go immediately to the emergency department.  Please let them know if your chest pain changes or gets worse. ?

## 2021-11-21 NOTE — ED Provider Notes (Signed)
HPI ? ?SUBJECTIVE: ? ?Denise Huffman is a 61 y.o. female who presents with constant, sharp, waxing and waning stabbing left-sided chest pain starting yesterday.  States that it is getting worse today.  It does not migrate up her neck, down her arm, or through to her back, although she is reporting some left arm numbness.  No nausea, diaphoresis, or exertional or positional component.  No fevers, cough, wheeze, shortness of breath, palpitations, lightheadedness, dizziness, abdominal pain, syncope.  No calf pain, surgery in the past 4 weeks, hemoptysis, recent immobilization.  She reports bilateral calf swelling, worse on the right.  No trauma to the chest, change in physical activity.  She has never had symptoms like this before.  States this does not feel like acid reflux.  She has not tried anything for this.  Symptoms are better when she pushes on her chest, no aggravating factors.  She has a past medical history of asthma, hypertension, hypercholesterolemia, GERD, anxiety, diabetes, resolved.  No history of MI, CAD, CVA, PAD/PVD.  She has never smoked.  Family history negative for early MI.  PCP: [facility] ? ?Past Medical History:  ?Diagnosis Date  ? Anxiety   ? Asthma   ? Depression   ? GERD (gastroesophageal reflux disease)   ? Hyperlipidemia   ? Hypertension   ? Insomnia   ? Vertigo   ? ? ?Past Surgical History:  ?Procedure Laterality Date  ? ABDOMINAL HYSTERECTOMY    ? BACK SURGERY    ? ? ?History reviewed. No pertinent family history. ? ?Social History  ? ?Tobacco Use  ? Smoking status: Never  ? Smokeless tobacco: Never  ?Vaping Use  ? Vaping Use: Never used  ?Substance Use Topics  ? Alcohol use: No  ? Drug use: No  ? ? ?No current facility-administered medications for this encounter. ? ?Current Outpatient Medications:  ?  cetirizine (ZYRTEC) 10 MG tablet, Take 10 mg by mouth daily., Disp: , Rfl:  ?  dicyclomine (BENTYL) 20 MG tablet, Take 20 mg by mouth every 6 (six) hours., Disp: , Rfl:  ?   lisinopril-hydrochlorothiazide (PRINZIDE,ZESTORETIC) 20-25 MG tablet, , Disp: , Rfl: 5 ?  omeprazole (PRILOSEC) 20 MG capsule, Take 20 mg by mouth daily., Disp: , Rfl:  ?  traMADol (ULTRAM) 50 MG tablet, , Disp: , Rfl: 2 ?  traZODone (DESYREL) 100 MG tablet, Take 100 mg by mouth at bedtime., Disp: , Rfl:  ?  triamcinolone ointment (KENALOG) 0.5 %, Apply 1 application topically 2 (two) times daily., Disp: 30 g, Rfl: 0 ?  albuterol (PROVENTIL HFA;VENTOLIN HFA) 108 (90 BASE) MCG/ACT inhaler, Inhale into the lungs every 6 (six) hours as needed for wheezing or shortness of breath., Disp: , Rfl:  ?  beclomethasone (QVAR) 80 MCG/ACT inhaler, Inhale into the lungs 2 (two) times daily., Disp: , Rfl:  ? ?No Known Allergies ? ? ?ROS ? ?As noted in HPI.  ? ?Physical Exam ? ?BP (!) 159/91 (BP Location: Right Arm)   Pulse 62   Temp 97.8 ?F (36.6 ?C) (Oral)   Ht 5\' 9"  (1.753 m)   Wt 90.7 kg   SpO2 94%   BMI 29.53 kg/m?  ? ?Constitutional: Well developed, well nourished, no acute distress ?Eyes:  EOMI, conjunctiva normal bilaterally ?HENT: Normocephalic, atraumatic,mucus membranes moist ?Respiratory: Normal inspiratory effort, lungs clear bilaterally.  No anterior, lateral chest wall tenderness ?Cardiovascular: Normal rate, regular rhythm, no murmurs, rubs, gallops ?GI: nondistended ?skin: No rash, skin intact ?Musculoskeletal: Calves symmetric, nontender.  Trace edema bilaterally. ?Neurologic: Alert & oriented x 3, no focal neuro deficits ?Psychiatric: Speech and behavior appropriate ? ? ?ED Course ? ? ?Medications - No data to display ? ?Orders Placed This Encounter  ?Procedures  ? EKG 12-Lead  ?  Standing Status:   Standing  ?  Number of Occurrences:   1  ? ? ?No results found for this or any previous visit (from the past 24 hour(s)). ?No results found. ? ?ED Clinical Impression ? ?1. Chest pain, unspecified type   ?  ? ?ED Assessment/Plan ? ?EKG: Normal sinus rhythm, rate 60.  Normal axis, normal intervals.  Nonspecific  ST-T wave changes.  No ST elevation.  No previous EKG for comparison.  Patient symptomatic while EKG obtained ? ?HEART score:   ?History: slightly suspicious 0 ?EKG: Nonspecific repolarization disturbance +1 ?Age: 37-64 +1 ?Risk factors: 2 risk factors (hypertension, hypercholesterolemia) +1 ?Troponin: Not available  ?Total heart score: 3 ? ?Reviewed report from EKG done at [facility] in 10/22, states nonspecific ST T wave changes.  Unable to see actual EKG.  Patient is low risk per heart score of 3, but no reproducible chest tenderness, and  she has never had symptoms like this before.  She states that the pain is getting worse, so transferring to the emergency department for further work-up.  Offered to transfer via EMS, but patient declined.  Feel that she is stable to go by private vehicle since it has been constant since yesterday and she has no signs of ischemia on EKG.  She did not drive herself here. ? ?Discussed rationale for transfer to the emergency department with patient.  She agrees to go to the [facility] ED. ? ?No orders of the defined types were placed in this encounter. ? ? ? ? ?*This clinic note was created using [employer]. Therefore, there may be occasional mistakes despite careful proofreading. ? ?? ? ?  ?Melynda Ripple, MD ?11/21/21 1808 ? ?

## 2021-11-21 NOTE — ED Notes (Signed)
Patient is being discharged from the Urgent Care and sent to the Emergency Department via POV . Per Dr. Chaney Malling, patient is in need of higher level of care due to chest pain. Patient is aware and verbalizes understanding of plan of care.  ?Vitals:  ? 11/21/21 1725  ?BP: (!) 159/91  ?Pulse: 62  ?Temp: 97.8 ?F (36.6 ?C)  ?SpO2: 94%  ?  ?

## 2021-11-21 NOTE — ED Triage Notes (Signed)
Patient c.o worsening chest pain for 2 days -- left arm tingling. Patient denies any cardiac Hx. Patient states she does get hurt burn but this does not feel like it.  ?

## 2023-02-10 ENCOUNTER — Ambulatory Visit
Admission: EM | Admit: 2023-02-10 | Discharge: 2023-02-10 | Disposition: A | Discharge status: Home / Self Care | Payer: BLUE CROSS/BLUE SHIELD | Attending: Physician Assistant | Admitting: Physician Assistant

## 2023-02-10 ENCOUNTER — Encounter: Payer: Self-pay | Admitting: Emergency Medicine

## 2023-02-10 DIAGNOSIS — R197 Diarrhea, unspecified: Secondary | ICD-10-CM | POA: Diagnosis not present

## 2023-02-10 DIAGNOSIS — K5792 Diverticulitis of intestine, part unspecified, without perforation or abscess without bleeding: Secondary | ICD-10-CM | POA: Diagnosis present

## 2023-02-10 DIAGNOSIS — N3000 Acute cystitis without hematuria: Secondary | ICD-10-CM | POA: Diagnosis not present

## 2023-02-10 DIAGNOSIS — R1032 Left lower quadrant pain: Secondary | ICD-10-CM | POA: Insufficient documentation

## 2023-02-10 LAB — CBC WITH DIFFERENTIAL/PLATELET
Abs Immature Granulocytes: 0.04 10*3/uL (ref 0.00–0.07)
Basophils Absolute: 0 10*3/uL (ref 0.0–0.1)
Basophils Relative: 0 %
Eosinophils Absolute: 0.1 10*3/uL (ref 0.0–0.5)
Eosinophils Relative: 1 %
HCT: 45.4 % (ref 36.0–46.0)
Hemoglobin: 15.7 g/dL — ABNORMAL HIGH (ref 12.0–15.0)
Immature Granulocytes: 1 %
Lymphocytes Relative: 16 %
Lymphs Abs: 1.3 10*3/uL (ref 0.7–4.0)
MCH: 32.8 pg (ref 26.0–34.0)
MCHC: 34.6 g/dL (ref 30.0–36.0)
MCV: 94.8 fL (ref 80.0–100.0)
Monocytes Absolute: 0.4 10*3/uL (ref 0.1–1.0)
Monocytes Relative: 5 %
Neutro Abs: 6.7 10*3/uL (ref 1.7–7.7)
Neutrophils Relative %: 77 %
Platelets: 184 10*3/uL (ref 150–400)
RBC: 4.79 MIL/uL (ref 3.87–5.11)
RDW: 13.6 % (ref 11.5–15.5)
WBC: 8.5 10*3/uL (ref 4.0–10.5)
nRBC: 0 % (ref 0.0–0.2)

## 2023-02-10 LAB — COMPREHENSIVE METABOLIC PANEL
ALT: 15 U/L (ref 0–44)
AST: 21 U/L (ref 15–41)
Albumin: 3.7 g/dL (ref 3.5–5.0)
Alkaline Phosphatase: 58 U/L (ref 38–126)
Anion gap: 9 (ref 5–15)
BUN: 19 mg/dL (ref 8–23)
CO2: 23 mmol/L (ref 22–32)
Calcium: 8.4 mg/dL — ABNORMAL LOW (ref 8.9–10.3)
Chloride: 104 mmol/L (ref 98–111)
Creatinine, Ser: 0.78 mg/dL (ref 0.44–1.00)
GFR, Estimated: >60 mL/min (ref >60)
Glucose, Bld: 132 mg/dL — ABNORMAL HIGH (ref 70–99)
Potassium: 3.6 mmol/L (ref 3.5–5.1)
Sodium: 136 mmol/L (ref 135–145)
Total Bilirubin: 0.8 mg/dL (ref 0.3–1.2)
Total Protein: 6.8 g/dL (ref 6.5–8.1)

## 2023-02-10 LAB — URINALYSIS, W/ REFLEX TO CULTURE (INFECTION SUSPECTED)
Glucose, UA: NEGATIVE mg/dL
Hgb urine dipstick: NEGATIVE
Nitrite: NEGATIVE
Specific Gravity, Urine: 1.025 (ref 1.005–1.030)
pH: 5.5 (ref 5.0–8.0)

## 2023-02-10 MED ORDER — KETOROLAC TROMETHAMINE 60 MG/2ML IM SOLN
30.0000 mg | Freq: Once | INTRAMUSCULAR | Status: AC
  Administered 2023-02-10: 30 mg via INTRAMUSCULAR

## 2023-02-10 MED ORDER — ONDANSETRON 8 MG PO TBDP
8.0000 mg | ORAL_TABLET | Freq: Once | ORAL | Status: AC
  Administered 2023-02-10: 8 mg via ORAL

## 2023-02-10 MED ORDER — AMOXICILLIN-POT CLAVULANATE 875-125 MG PO TABS: 1.0000 | ORAL_TABLET | Freq: Two times a day (BID) | ORAL | 0 refills | Status: AC

## 2023-02-10 NOTE — ED Provider Notes (Signed)
[facility]    CSN: [id number] Arrival date & time: 02/10/23  0840      History   Chief Complaint Chief Complaint  Patient presents with   Abdominal Pain   Diarrhea    HPI Denise Huffman is a 62 y.o. female presenting for lower abdominal cramping for the past 2 days.  She reports it was upper abdominal cramping mostly yesterday but now it has moved to the suprapubic region and left lower quadrant.  She also reports a lot of diarrhea.  No black or bloody stool.  No fever.  Reports chills, nausea without vomiting.  No URI symptoms.  No urinary symptoms.  No sick contacts or recent travel.  Patient has not taken any OTC meds.  She has tried dicyclomine without relief.  She says her current symptoms feel similar to when she had diverticulosis/diverticulitis flareup.  She has a past medical history of asthma, hypertension, hypercholesterolemia, GERD, anxiety, depression, diabetes, resolved.  She also reports a history of diverticulosis/diverticulitis and colitis.  No history of MI, CAD, CVA, PAD/PVD. She has never smoked. Family history negative for early MI. PCP: [facility]   HPI  Past Medical History:  Diagnosis Date   Anxiety    Asthma    Depression    GERD (gastroesophageal reflux disease)    Hyperlipidemia    Hypertension    Insomnia    Vertigo     Patient Active Problem List   Diagnosis Date Noted   Hypertension 04/17/2018   Pain in both lower extremities 04/01/2018   Bilateral lower extremity edema 04/01/2018    Past Surgical History:  Procedure Laterality Date   ABDOMINAL HYSTERECTOMY     BACK SURGERY      OB History   No obstetric history on file.      Home Medications    Prior to Admission medications   Medication Sig Start Date End Date Taking? Authorizing Provider  albuterol (PROVENTIL HFA;VENTOLIN HFA) 108 (90 BASE) MCG/ACT inhaler Inhale into the lungs every 6 (six) hours as needed for wheezing or shortness of breath.   Yes [provider]  amLODipine (NORVASC) 10 MG tablet Take 1 tablet by mouth daily. 08/30/22 08/30/23 Yes [provider]  amoxicillin-clavulanate (AUGMENTIN) 875-125 MG tablet Take 1 tablet by mouth every 12 (twelve) hours for 7 days. 02/10/23 02/17/23 Yes Eusebio Friendly B, PA-C  ARIPiprazole (ABILIFY) 10 MG tablet Take by mouth. 02/06/23  Yes [provider]  atorvastatin (LIPITOR) 20 MG tablet Take 1 tablet by mouth daily. 05/14/22 05/14/23 Yes [provider]  beclomethasone (QVAR) 80 MCG/ACT inhaler Inhale into the lungs 2 (two) times daily.   Yes [provider]  buPROPion (WELLBUTRIN XL) 150 MG 24 hr tablet Take 1 tablet by mouth every morning. 09/27/20 10/07/23 Yes [provider]  cetirizine (ZYRTEC) 10 MG tablet Take 10 mg by mouth daily.   Yes [provider]  dicyclomine (BENTYL) 20 MG tablet Take 20 mg by mouth every 6 (six) hours.   Yes [provider]  lisinopril-hydrochlorothiazide (PRINZIDE,ZESTORETIC) 20-25 MG tablet  02/18/18  Yes [provider]  omeprazole (PRILOSEC) 20 MG capsule Take 20 mg by mouth daily.   Yes [provider]  traMADol Janean Sark) 50 MG tablet  03/27/18  Yes [provider]  traZODone (DESYREL) 100 MG tablet Take 100 mg by mouth at bedtime.   Yes [provider]  triamcinolone ointment (KENALOG) 0.5 % Apply 1 application topically 2 (two) times daily.  01/19/19   Everlene Other G, DO  ipratropium (ATROVENT) 0.06 % nasal spray Place 2 sprays into both nostrils 4 (four) times daily.  12/11/19  [provider]    Family History History reviewed. No pertinent family history.  Social History Social History   Tobacco Use   Smoking status: Never   Smokeless tobacco: Never  Vaping Use   Vaping status: Never Used  Substance Use Topics   Alcohol use: No   Drug use: No     Allergies   Patient has no known allergies.   Review of Systems Review of Systems   Constitutional:  Positive for appetite change and chills. Negative for fatigue and fever.  HENT:  Negative for congestion, rhinorrhea and sore throat.   Respiratory:  Negative for cough and shortness of breath.   Cardiovascular:  Negative for chest pain.  Gastrointestinal:  Positive for abdominal pain, diarrhea and nausea. Negative for constipation and vomiting.  Genitourinary:  Negative for dysuria, flank pain and frequency.  Musculoskeletal:  Negative for back pain.  Neurological:  Negative for weakness and headaches.     Physical Exam Triage Vital Signs ED Triage Vitals  Encounter Vitals Group     BP      Systolic BP Percentile      Diastolic BP Percentile      Pulse      Resp      Temp      Temp src      SpO2      Weight      Height      Head Circumference      Peak Flow      Pain Score      Pain Loc      Pain Education      Exclude from Growth Chart    No data found.  Updated Vital Signs BP (!) 169/97 (BP Location: Left Arm)   Pulse 79   Temp 98.2 F (36.8 C) (Oral)   Resp 16   SpO2 91%      Physical Exam Vitals and nursing note reviewed.  Constitutional:      General: She is not in acute distress.    Appearance: Normal appearance. She is not ill-appearing or toxic-appearing.  HENT:     Head: Normocephalic and atraumatic.     Nose: Nose normal.     Mouth/Throat:     Mouth: Mucous membranes are moist.     Pharynx: Oropharynx is clear.  Eyes:     General: No scleral icterus.       Right eye: No discharge.        Left eye: No discharge.     Conjunctiva/sclera: Conjunctivae normal.  Cardiovascular:     Rate and Rhythm: Normal rate and regular rhythm.     Heart sounds: Normal heart sounds.  Pulmonary:     Effort: Pulmonary effort is normal. No respiratory distress.     Breath sounds: Normal breath sounds.  Abdominal:     Palpations: Abdomen is soft.     Tenderness: There is abdominal tenderness in the suprapubic area and left lower quadrant. There  is no right CVA tenderness, left CVA tenderness, guarding or rebound.  Musculoskeletal:     Cervical back: Neck supple.  Skin:    General: Skin is dry.  Neurological:     General: No focal deficit present.     Mental Status: She is alert. Mental status is at baseline.     Motor: No weakness.  Gait: Gait normal.  Psychiatric:        Mood and Affect: Mood normal.        Behavior: Behavior normal.        Thought Content: Thought content normal.      UC Treatments / Results  Labs (all labs ordered are listed, but only abnormal results are displayed) Labs Reviewed  COMPREHENSIVE METABOLIC PANEL - Abnormal; Notable for the following components:      Result Value   Glucose, Bld 132 (*)    Calcium 8.4 (*)    All other components within normal limits  CBC WITH DIFFERENTIAL/PLATELET - Abnormal; Notable for the following components:   Hemoglobin 15.7 (*)    All other components within normal limits  URINALYSIS, W/ REFLEX TO CULTURE (INFECTION SUSPECTED) - Abnormal; Notable for the following components:   Bilirubin Urine SMALL (*)    Ketones, ur TRACE (*)    Protein, ur TRACE (*)    Leukocytes,Ua MODERATE (*)    Bacteria, UA MANY (*)    All other components within normal limits  URINE CULTURE    EKG   Radiology No results found.  Procedures Procedures (including critical care time)  Medications Ordered in UC Medications  ketorolac (TORADOL) injection 30 mg (30 mg Intramuscular Given 02/10/23 0915)  ondansetron (ZOFRAN-ODT) disintegrating tablet 8 mg (8 mg Oral Given 02/10/23 0914)    Initial Impression / Assessment and Plan / UC Course  I have reviewed the triage vital signs and the nursing notes.  Pertinent labs & imaging results that were available during my care of the patient were reviewed by me and considered in my medical decision making (see chart for details).   62 year old female presents for lower abdominal cramping, nausea, chills and multiple episodes of  diarrhea.  Symptom onset yesterday.  She feels that it is similar to when she had diverticulitis/colitis.  No improvement with dicyclomine which she takes occasionally for IBS.  She is a febrile.  Overall well-appearing but is tearful at times when explaining that she is uncomfortable.  On exam abdomen is soft and she has suprapubic tenderness but mostly left lower quadrant tenderness to palpation.  Chest clear to auscultation heart regular rate and rhythm.  Urinalysis obtained as well as CBC, CMP.  Patient given 30 mg IM ketorolac in clinic for acute pain relief also given 8 mg ODT Zofran for nausea.  CBC shows normal WBC count.  CMP shows slightly elevated glucose 132, otherwise normal.  Urinalysis shows bili, ketones, protein, moderate leukocytes, many bacteria.  Will send urine for culture.  Patient reported improvement in discomfort after ketorolac injection and relief of nausea after Zofran.  Suspect diverticulitis flareup and possible urinary tract infection.  Treating with Augmentin.  Advise she should be feeling better in a couple of days.  Encouraged her to increase rest and fluids and continue with Tylenol, dicyclomine.  Advise going to ED if she develops a fever or worsening abdominal pain or is not starting to feel better in a couple of days.  Patient is agreeable.  Acute flareup of chronic underlying condition.  Final Clinical Impressions(s) / UC Diagnoses   Final diagnoses:  Acute diverticulitis  Diarrhea, unspecified type  Left lower quadrant abdominal pain  Acute cystitis without hematuria     Discharge Instructions      -Your lab work is overall reassuring.  Your white blood cell count is not elevated.  It does look like you may have a urinary tract infection. - You  may also have diverticulitis given your history.  The antibiotic I sent to the pharmacy should treat both diverticulitis, suspected and possible urinary tract infection.  Increase your rest and fluids.  We  may contact you in a couple days and add an additional antibiotic if the Augmentin does not cover possible UTI. - May continue dicyclomine, Tylenol, rest and fluids. - If you develop fever or worsening abdominal pain or no improvement in 2 days you should go to the ER for further evaluation, likely to include CT scan.     ED Prescriptions     Medication Sig Dispense Auth. Provider   amoxicillin-clavulanate (AUGMENTIN) 875-125 MG tablet Take 1 tablet by mouth every 12 (twelve) hours for 7 days. 14 tablet Gareth Morgan      PDMP not reviewed this encounter.   Shirlee Latch, PA-C 02/10/23 1017

## 2023-02-10 NOTE — ED Triage Notes (Signed)
Pt presents with abdominal cramping and diarrhea that started yesterday.

## 2023-02-10 NOTE — Discharge Instructions (Addendum)
-  Your lab work is overall reassuring.  Your white blood cell count is not elevated.  It does look like you may have a urinary tract infection. - You may also have diverticulitis given your history.  The antibiotic I sent to the pharmacy should treat both diverticulitis, suspected and possible urinary tract infection.  Increase your rest and fluids.  We may contact you in a couple days and add an additional antibiotic if the Augmentin does not cover possible UTI. - May continue dicyclomine, Tylenol, rest and fluids. - If you develop fever or worsening abdominal pain or no improvement in 2 days you should go to the ER for further evaluation, likely to include CT scan.

## 2023-02-11 LAB — URINE CULTURE

## 2023-03-07 ENCOUNTER — Other Ambulatory Visit: Payer: BLUE CROSS/BLUE SHIELD | Admitting: Radiology

## 2023-03-07 ENCOUNTER — Inpatient Hospital Stay
Admission: EM | Admit: 2023-03-07 | Discharge: 2023-03-11 | DRG: 444 | Disposition: A | Discharge status: Home / Self Care | Payer: BLUE CROSS/BLUE SHIELD | Source: Ambulatory Visit | Attending: Student | Admitting: Student

## 2023-03-07 ENCOUNTER — Encounter: Payer: Self-pay | Admitting: Emergency Medicine

## 2023-03-07 ENCOUNTER — Emergency Department: Payer: BLUE CROSS/BLUE SHIELD

## 2023-03-07 ENCOUNTER — Other Ambulatory Visit: Payer: Self-pay

## 2023-03-07 ENCOUNTER — Ambulatory Visit
Admission: EM | Admit: 2023-03-07 | Discharge: 2023-03-07 | Disposition: A | Discharge status: Home / Self Care | Payer: BLUE CROSS/BLUE SHIELD | Attending: Internal Medicine | Admitting: Internal Medicine

## 2023-03-07 DIAGNOSIS — R0902 Hypoxemia: Secondary | ICD-10-CM

## 2023-03-07 DIAGNOSIS — K8000 Calculus of gallbladder with acute cholecystitis without obstruction: Secondary | ICD-10-CM | POA: Diagnosis not present

## 2023-03-07 DIAGNOSIS — R42 Dizziness and giddiness: Secondary | ICD-10-CM

## 2023-03-07 DIAGNOSIS — E876 Hypokalemia: Secondary | ICD-10-CM | POA: Diagnosis not present

## 2023-03-07 DIAGNOSIS — R112 Nausea with vomiting, unspecified: Secondary | ICD-10-CM

## 2023-03-07 DIAGNOSIS — F32A Depression, unspecified: Secondary | ICD-10-CM | POA: Insufficient documentation

## 2023-03-07 DIAGNOSIS — R8281 Pyuria: Secondary | ICD-10-CM | POA: Diagnosis present

## 2023-03-07 DIAGNOSIS — I10 Essential (primary) hypertension: Secondary | ICD-10-CM | POA: Diagnosis present

## 2023-03-07 DIAGNOSIS — E785 Hyperlipidemia, unspecified: Secondary | ICD-10-CM | POA: Diagnosis present

## 2023-03-07 DIAGNOSIS — R0609 Other forms of dyspnea: Secondary | ICD-10-CM | POA: Diagnosis not present

## 2023-03-07 DIAGNOSIS — Z79899 Other long term (current) drug therapy: Secondary | ICD-10-CM | POA: Diagnosis not present

## 2023-03-07 DIAGNOSIS — J9601 Acute respiratory failure with hypoxia: Secondary | ICD-10-CM | POA: Diagnosis not present

## 2023-03-07 DIAGNOSIS — K828 Other specified diseases of gallbladder: Secondary | ICD-10-CM | POA: Diagnosis present

## 2023-03-07 DIAGNOSIS — R0789 Other chest pain: Secondary | ICD-10-CM | POA: Diagnosis not present

## 2023-03-07 DIAGNOSIS — R7989 Other specified abnormal findings of blood chemistry: Secondary | ICD-10-CM | POA: Diagnosis not present

## 2023-03-07 DIAGNOSIS — J9811 Atelectasis: Secondary | ICD-10-CM | POA: Diagnosis present

## 2023-03-07 DIAGNOSIS — J189 Pneumonia, unspecified organism: Secondary | ICD-10-CM | POA: Diagnosis present

## 2023-03-07 DIAGNOSIS — D72829 Elevated white blood cell count, unspecified: Secondary | ICD-10-CM | POA: Insufficient documentation

## 2023-03-07 DIAGNOSIS — K810 Acute cholecystitis: Principal | ICD-10-CM | POA: Diagnosis present

## 2023-03-07 DIAGNOSIS — Z90710 Acquired absence of both cervix and uterus: Secondary | ICD-10-CM

## 2023-03-07 DIAGNOSIS — I2489 Other forms of acute ischemic heart disease: Secondary | ICD-10-CM | POA: Diagnosis present

## 2023-03-07 DIAGNOSIS — J45909 Unspecified asthma, uncomplicated: Secondary | ICD-10-CM | POA: Diagnosis present

## 2023-03-07 DIAGNOSIS — K219 Gastro-esophageal reflux disease without esophagitis: Secondary | ICD-10-CM | POA: Insufficient documentation

## 2023-03-07 DIAGNOSIS — I959 Hypotension, unspecified: Secondary | ICD-10-CM | POA: Diagnosis not present

## 2023-03-07 DIAGNOSIS — R079 Chest pain, unspecified: Secondary | ICD-10-CM | POA: Diagnosis not present

## 2023-03-07 DIAGNOSIS — R9431 Abnormal electrocardiogram [ECG] [EKG]: Secondary | ICD-10-CM | POA: Diagnosis not present

## 2023-03-07 DIAGNOSIS — Z1152 Encounter for screening for COVID-19: Secondary | ICD-10-CM | POA: Diagnosis not present

## 2023-03-07 LAB — CBC WITH DIFFERENTIAL/PLATELET
Abs Immature Granulocytes: 0.09 10*3/uL — ABNORMAL HIGH (ref 0.00–0.07)
Basophils Absolute: 0 10*3/uL (ref 0.0–0.1)
Basophils Relative: 0 %
Eosinophils Absolute: 0 10*3/uL (ref 0.0–0.5)
Eosinophils Relative: 0 %
HCT: 48.7 % — ABNORMAL HIGH (ref 36.0–46.0)
Hemoglobin: 16.2 g/dL — ABNORMAL HIGH (ref 12.0–15.0)
Immature Granulocytes: 1 %
Lymphocytes Relative: 8 %
Lymphs Abs: 1.1 10*3/uL (ref 0.7–4.0)
MCH: 31.8 pg (ref 26.0–34.0)
MCHC: 33.3 g/dL (ref 30.0–36.0)
MCV: 95.5 fL (ref 80.0–100.0)
Monocytes Absolute: 0.6 10*3/uL (ref 0.1–1.0)
Monocytes Relative: 4 %
Neutro Abs: 12.5 10*3/uL — ABNORMAL HIGH (ref 1.7–7.7)
Neutrophils Relative %: 87 %
Platelets: 221 10*3/uL (ref 150–400)
RBC: 5.1 MIL/uL (ref 3.87–5.11)
RDW: 12.4 % (ref 11.5–15.5)
WBC: 14.4 10*3/uL — ABNORMAL HIGH (ref 4.0–10.5)
nRBC: 0 % (ref 0.0–0.2)

## 2023-03-07 LAB — COMPREHENSIVE METABOLIC PANEL
ALT: 18 U/L (ref 0–44)
AST: 19 U/L (ref 15–41)
Albumin: 4.1 g/dL (ref 3.5–5.0)
Alkaline Phosphatase: 59 U/L (ref 38–126)
Anion gap: 12 (ref 5–15)
BUN: 21 mg/dL (ref 8–23)
CO2: 29 mmol/L (ref 22–32)
Calcium: 9.7 mg/dL (ref 8.9–10.3)
Chloride: 99 mmol/L (ref 98–111)
Creatinine, Ser: 0.75 mg/dL (ref 0.44–1.00)
GFR, Estimated: >60 mL/min (ref >60)
Glucose, Bld: 147 mg/dL — ABNORMAL HIGH (ref 70–99)
Potassium: 4.1 mmol/L (ref 3.5–5.1)
Sodium: 140 mmol/L (ref 135–145)
Total Bilirubin: 0.9 mg/dL (ref 0.3–1.2)
Total Protein: 7.4 g/dL (ref 6.5–8.1)

## 2023-03-07 LAB — URINALYSIS, ROUTINE W REFLEX MICROSCOPIC
Bilirubin Urine: NEGATIVE
Glucose, UA: NEGATIVE mg/dL
Hgb urine dipstick: NEGATIVE
Ketones, ur: NEGATIVE mg/dL
Nitrite: NEGATIVE
Protein, ur: 30 mg/dL — AB
Specific Gravity, Urine: >1.046 — ABNORMAL HIGH (ref 1.005–1.030)
pH: 6 (ref 5.0–8.0)

## 2023-03-07 LAB — RESP PANEL BY RT-PCR (RSV, FLU A&B, COVID)  RVPGX2
Influenza A by PCR: NEGATIVE
Influenza B by PCR: NEGATIVE
Resp Syncytial Virus by PCR: NEGATIVE
SARS Coronavirus 2 by RT PCR: NEGATIVE

## 2023-03-07 LAB — PROTIME-INR
INR: 1.1 (ref 0.8–1.2)
Prothrombin Time: 13.9 seconds (ref 11.4–15.2)

## 2023-03-07 LAB — TROPONIN I (HIGH SENSITIVITY)
Troponin I (High Sensitivity): 229 ng/L (ref <18)
Troponin I (High Sensitivity): 250 ng/L (ref <18)
Troponin I (High Sensitivity): 404 ng/L (ref <18)
Troponin I (High Sensitivity): 370 ng/L (ref <18)

## 2023-03-07 LAB — HEPARIN LEVEL (UNFRACTIONATED): Heparin Unfractionated: <0.1 IU/mL — ABNORMAL LOW (ref 0.30–0.70)

## 2023-03-07 LAB — LACTIC ACID, PLASMA
Lactic Acid, Venous: 1.3 mmol/L (ref 0.5–1.9)
Lactic Acid, Venous: 1.1 mmol/L (ref 0.5–1.9)

## 2023-03-07 LAB — LIPASE, BLOOD: Lipase: 28 U/L (ref 11–51)

## 2023-03-07 MED ORDER — ACETAMINOPHEN 650 MG RE SUPP: 650.0000 mg | Freq: Four times a day (QID) | RECTAL | Status: DC | PRN

## 2023-03-07 MED ORDER — SODIUM CHLORIDE 0.9 % IV SOLN: INTRAVENOUS | Status: AC

## 2023-03-07 MED ORDER — ACETAMINOPHEN 325 MG PO TABS
650.0000 mg | ORAL_TABLET | Freq: Four times a day (QID) | ORAL | Status: DC | PRN
  Administered 2023-03-08 – 2023-03-11 (×8): 650 mg via ORAL
  Filled 2023-03-07 (×8): qty 2

## 2023-03-07 MED ORDER — BUPROPION HCL ER (XL) 150 MG PO TB24
150.0000 mg | ORAL_TABLET | Freq: Every morning | ORAL | Status: DC
  Administered 2023-03-08 – 2023-03-11 (×4): 150 mg via ORAL
  Filled 2023-03-07 (×4): qty 1

## 2023-03-07 MED ORDER — SENNOSIDES-DOCUSATE SODIUM 8.6-50 MG PO TABS
1.0000 | ORAL_TABLET | Freq: Every evening | ORAL | Status: DC | PRN
  Administered 2023-03-10: 1 via ORAL
  Filled 2023-03-07: qty 1

## 2023-03-07 MED ORDER — ASPIRIN 325 MG PO TABS: 325.0000 mg | ORAL_TABLET | Freq: Every day | ORAL | Status: DC

## 2023-03-07 MED ORDER — TRAZODONE HCL 100 MG PO TABS
100.0000 mg | ORAL_TABLET | Freq: Every day | ORAL | Status: DC
  Administered 2023-03-07 – 2023-03-10 (×4): 100 mg via ORAL
  Filled 2023-03-07 (×4): qty 1

## 2023-03-07 MED ORDER — LISINOPRIL 20 MG PO TABS
20.0000 mg | ORAL_TABLET | Freq: Every day | ORAL | Status: DC
  Administered 2023-03-07 – 2023-03-08 (×2): 20 mg via ORAL
  Filled 2023-03-07 (×2): qty 1

## 2023-03-07 MED ORDER — ONDANSETRON 8 MG PO TBDP
8.0000 mg | ORAL_TABLET | Freq: Once | ORAL | Status: AC
  Administered 2023-03-07: 8 mg via ORAL

## 2023-03-07 MED ORDER — HEPARIN (PORCINE) 25000 UT/250ML-% IV SOLN
1350.0000 [IU]/h | INTRAVENOUS | Status: DC
  Administered 2023-03-07: 1350 [IU]/h via INTRAVENOUS
  Administered 2023-03-07: 1050 [IU]/h via INTRAVENOUS
  Administered 2023-03-08: 1350 [IU]/h via INTRAVENOUS
  Filled 2023-03-07 (×2): qty 250

## 2023-03-07 MED ORDER — IPRATROPIUM-ALBUTEROL 0.5-2.5 (3) MG/3ML IN SOLN: 3.0000 mL | Freq: Four times a day (QID) | RESPIRATORY_TRACT | Status: AC | PRN

## 2023-03-07 MED ORDER — HEPARIN BOLUS VIA INFUSION
4000.0000 [IU] | Freq: Once | INTRAVENOUS | Status: AC
  Administered 2023-03-07: 4000 [IU] via INTRAVENOUS
  Filled 2023-03-07: qty 4000

## 2023-03-07 MED ORDER — ONDANSETRON HCL 4 MG/2ML IJ SOLN
4.0000 mg | Freq: Four times a day (QID) | INTRAMUSCULAR | Status: DC | PRN
  Administered 2023-03-07 – 2023-03-08 (×2): 4 mg via INTRAVENOUS
  Filled 2023-03-07 (×2): qty 2

## 2023-03-07 MED ORDER — ASPIRIN 81 MG PO CHEW: 324.0000 mg | CHEWABLE_TABLET | Freq: Once | ORAL | Status: DC

## 2023-03-07 MED ORDER — IOHEXOL 350 MG/ML SOLN
80.0000 mL | Freq: Once | INTRAVENOUS | Status: AC | PRN
  Administered 2023-03-07: 80 mL via INTRAVENOUS

## 2023-03-07 MED ORDER — HYDRALAZINE HCL 20 MG/ML IJ SOLN: 5.0000 mg | Freq: Four times a day (QID) | INTRAMUSCULAR | Status: DC | PRN

## 2023-03-07 MED ORDER — MORPHINE SULFATE (PF) 2 MG/ML IV SOLN
2.0000 mg | INTRAVENOUS | Status: AC | PRN
  Administered 2023-03-08 – 2023-03-09 (×3): 2 mg via INTRAVENOUS
  Filled 2023-03-07 (×4): qty 1

## 2023-03-07 MED ORDER — LISINOPRIL-HYDROCHLOROTHIAZIDE 20-25 MG PO TABS: 1.0000 | ORAL_TABLET | Freq: Every day | ORAL | Status: DC

## 2023-03-07 MED ORDER — ONDANSETRON HCL 4 MG/2ML IJ SOLN
4.0000 mg | Freq: Once | INTRAMUSCULAR | Status: AC
  Administered 2023-03-07: 4 mg via INTRAVENOUS
  Filled 2023-03-07: qty 2

## 2023-03-07 MED ORDER — HEPARIN BOLUS VIA INFUSION
2550.0000 [IU] | Freq: Once | INTRAVENOUS | Status: AC
  Administered 2023-03-07: 2550 [IU] via INTRAVENOUS
  Filled 2023-03-07: qty 2550

## 2023-03-07 MED ORDER — MORPHINE SULFATE (PF) 4 MG/ML IV SOLN
4.0000 mg | INTRAVENOUS | Status: AC | PRN
  Administered 2023-03-07 – 2023-03-08 (×2): 4 mg via INTRAVENOUS
  Filled 2023-03-07 (×3): qty 1

## 2023-03-07 MED ORDER — SODIUM CHLORIDE 0.9 % IV BOLUS (SEPSIS)
1000.0000 mL | Freq: Once | INTRAVENOUS | Status: AC
  Administered 2023-03-07: 1000 mL via INTRAVENOUS

## 2023-03-07 MED ORDER — HYDROCHLOROTHIAZIDE 25 MG PO TABS
25.0000 mg | ORAL_TABLET | Freq: Every day | ORAL | Status: DC
  Administered 2023-03-07 – 2023-03-08 (×2): 25 mg via ORAL
  Filled 2023-03-07 (×2): qty 1

## 2023-03-07 MED ORDER — ARIPIPRAZOLE 10 MG PO TABS
10.0000 mg | ORAL_TABLET | Freq: Every day | ORAL | Status: DC
  Administered 2023-03-08 – 2023-03-11 (×4): 10 mg via ORAL
  Filled 2023-03-07 (×4): qty 1

## 2023-03-07 MED ORDER — ONDANSETRON HCL 4 MG PO TABS: 4.0000 mg | ORAL_TABLET | Freq: Four times a day (QID) | ORAL | Status: DC | PRN

## 2023-03-07 MED ORDER — PANTOPRAZOLE SODIUM 40 MG IV SOLR
40.0000 mg | Freq: Two times a day (BID) | INTRAVENOUS | Status: AC
  Administered 2023-03-07 – 2023-03-08 (×3): 40 mg via INTRAVENOUS
  Filled 2023-03-07 (×3): qty 10

## 2023-03-07 MED ORDER — MORPHINE SULFATE (PF) 4 MG/ML IV SOLN
4.0000 mg | Freq: Once | INTRAVENOUS | Status: AC
  Administered 2023-03-07: 4 mg via INTRAVENOUS
  Filled 2023-03-07: qty 1

## 2023-03-07 MED ORDER — PIPERACILLIN-TAZOBACTAM 3.375 G IVPB
3.3750 g | Freq: Three times a day (TID) | INTRAVENOUS | Status: DC
  Administered 2023-03-07 – 2023-03-11 (×11): 3.375 g via INTRAVENOUS
  Filled 2023-03-07 (×11): qty 50

## 2023-03-07 MED ORDER — PIPERACILLIN-TAZOBACTAM 3.375 G IVPB 30 MIN
3.3750 g | Freq: Once | INTRAVENOUS | Status: AC
  Administered 2023-03-07: 3.375 g via INTRAVENOUS
  Filled 2023-03-07: qty 50

## 2023-03-07 MED ORDER — AMLODIPINE BESYLATE 10 MG PO TABS
10.0000 mg | ORAL_TABLET | Freq: Every day | ORAL | Status: DC
  Administered 2023-03-07 – 2023-03-08 (×2): 10 mg via ORAL
  Filled 2023-03-07 (×2): qty 1

## 2023-03-07 MED ORDER — FENTANYL CITRATE PF 50 MCG/ML IJ SOSY
50.0000 ug | PREFILLED_SYRINGE | Freq: Once | INTRAMUSCULAR | Status: AC
  Administered 2023-03-07: 50 ug via INTRAVENOUS
  Filled 2023-03-07: qty 1

## 2023-03-07 NOTE — Sepsis Progress Note (Addendum)
Elink following code sepsis  1438 Code Sepsis monitoring discontinued due to cancellation.

## 2023-03-07 NOTE — ED Notes (Signed)
EMS Called for patient transport.

## 2023-03-07 NOTE — Consult Note (Signed)
SURGICAL CONSULTATION NOTE   HISTORY OF PRESENT ILLNESS (HPI):  62 y.o. female presented to [facility] ED for evaluation of . Patient reports she started having significant abdominal pain and nausea and vomiting the last 48 hours.  She also endorses that she has been having intermittent limited pain for the last week or 2.  She endorsed that the pain radiates to the upper chest.  Patient also concern of shortness of breath.  Patient endorses the aggravating factor was eating last night.  No alleviating factors.  At the ED she was found with tenderness of the patient in the upper abdomen.  She was also found with shortness of breath and hypoxemia of 88% on room air.  O2 sat improved with nasal cannula at 2 L.  She had a CT scan of the abdomen and pelvis that shows cholelithiasis with pericholecystic fluid.  I personally evaluated the images.  She also was found with elevated troponins.  Initial troponin was 229, 2 hours later troponin increased 250.  Surgery is consulted by Dr. Sedalia Muta in this context for evaluation and management of cholecystitis.  PAST MEDICAL HISTORY (PMH):  Past Medical History:  Diagnosis Date   Anxiety    Asthma    Depression    GERD (gastroesophageal reflux disease)    Hyperlipidemia    Hypertension    Insomnia    Vertigo      PAST SURGICAL HISTORY (PSH):  Past Surgical History:  Procedure Laterality Date   ABDOMINAL HYSTERECTOMY     BACK SURGERY       MEDICATIONS:  Prior to Admission medications   Medication Sig Start Date End Date Taking? Authorizing Provider  amLODipine (NORVASC) 10 MG tablet Take 1 tablet by mouth daily. 08/30/22 08/30/23 Yes [provider]  atorvastatin (LIPITOR) 20 MG tablet Take 1 tablet by mouth daily. 05/14/22 05/14/23 Yes [provider]  lisinopril-hydrochlorothiazide (PRINZIDE,ZESTORETIC) 20-25 MG tablet  02/18/18  Yes [provider]  albuterol (PROVENTIL HFA;VENTOLIN HFA) 108 (90 BASE) MCG/ACT inhaler Inhale into  the lungs every 6 (six) hours as needed for wheezing or shortness of breath.    [provider]  ARIPiprazole (ABILIFY) 10 MG tablet Take by mouth. 02/06/23   [provider]  beclomethasone (QVAR) 80 MCG/ACT inhaler Inhale into the lungs 2 (two) times daily.    [provider]  buPROPion (WELLBUTRIN XL) 150 MG 24 hr tablet Take 1 tablet by mouth every morning. 09/27/20 10/07/23  [provider]  cetirizine (ZYRTEC) 10 MG tablet Take 10 mg by mouth daily.    [provider]  dicyclomine (BENTYL) 20 MG tablet Take 20 mg by mouth every 6 (six) hours.    [provider]  omeprazole (PRILOSEC) 20 MG capsule Take 20 mg by mouth daily.    [provider]  traMADol Janean Sark) 50 MG tablet  03/27/18   [provider]  traZODone (DESYREL) 100 MG tablet Take 100 mg by mouth at bedtime.    [provider]  triamcinolone ointment (KENALOG) 0.5 % Apply 1 application topically 2 (two) times daily. 01/19/19   Everlene Other G, DO  ipratropium (ATROVENT) 0.06 % nasal spray Place 2 sprays into both nostrils 4 (four) times daily.  12/11/19  [provider]     ALLERGIES:  No Known Allergies   SOCIAL HISTORY:  Social History   Socioeconomic History   Marital status: Single    Spouse name: Not on file   Number of children: Not on file  Years of education: Not on file   Highest education level: Not on file  Occupational History   Not on file  Tobacco Use   Smoking status: Never   Smokeless tobacco: Never  Vaping Use   Vaping status: Never Used  Substance and Sexual Activity   Alcohol use: No   Drug use: No   Sexual activity: Not Currently  Other Topics Concern   Not on file  Social History Narrative   Not on file   Social Determinants of Health   Financial Resource Strain: Low Risk  (11/26/2022)   Received from [facility]   Overall Financial Resource Strain (CARDIA)    Difficulty of Paying Living Expenses: Not  hard at all  Food Insecurity: No Food Insecurity (03/07/2023)   Hunger Vital Sign    Worried About Running Out of Food in the Last Year: Never true    Ran Out of Food in the Last Year: Never true  Transportation Needs: No Transportation Needs (03/07/2023)   PRAPARE - Administrator, Civil Service (Medical): No    Lack of Transportation (Non-Medical): No  Physical Activity: Insufficiently Active (02/09/2021)   Received from [facility]   Exercise Vital Sign    Days of Exercise per Week: 2 days    Minutes of Exercise per Session: 20 min  Stress: Not on file  Social Connections: Not on file  Intimate Partner Violence: Not At Risk (03/07/2023)   Humiliation, Afraid, Rape, and Kick questionnaire    Fear of Current or Ex-Partner: No    Emotionally Abused: No    Physically Abused: No    Sexually Abused: No      FAMILY HISTORY:  History reviewed. No pertinent family history.   REVIEW OF SYSTEMS:  Constitutional: denies weight loss, fever, chills, or sweats  Eyes: denies any other vision changes, history of eye injury  ENT: denies sore throat, hearing problems  Respiratory: Positive shortness of breath Cardiovascular: Positive chest pain Gastrointestinal: Positive abdominal pain, nausea and vomiting Genitourinary: denies burning with urination or urinary frequency Musculoskeletal: denies any other joint pains or cramps  Skin: denies any other rashes or skin discolorations  Neurological: denies any other headache, dizziness, weakness  Psychiatric: denies any other depression, anxiety   All other review of systems were negative   VITAL SIGNS:  Temp:  [98.5 F (36.9 C)-99.1 F (37.3 C)] 98.5 F (36.9 C) (08/09 1558) Pulse Rate:  [66-88] 66 (08/09 1450) Resp:  [9-22] 17 (08/09 1000) BP: (149-170)/(71-115) 149/71 (08/09 1450) SpO2:  [88 %-95 %] 93 % (08/09 1450) Weight:  [90 kg-90.7 kg] 90 kg (08/09 0948)     Height: 5\' 9"  (175.3 cm) Weight: 90 kg BMI (Calculated): 29.29    INTAKE/OUTPUT:  This shift: Total I/O In: 1050 [IV Piggyback:1050] Out: -   Last 2 shifts: @IOLAST2SHIFTS @   PHYSICAL EXAM:  Constitutional:  -- Normal body habitus  -- Awake, alert, and oriented x3  Eyes:  -- Pupils equally round and reactive to light  -- No scleral icterus  Ear, nose, and throat:  -- No jugular venous distension  Pulmonary:  -- No crackles  -- Equal breath sounds bilaterally -- Breathing non-labored at rest Cardiovascular:  -- S1, S2 present  -- No pericardial rubs Gastrointestinal:  -- Abdomen soft, tender in the right upper quadrant, non-distended, no guarding or rebound tenderness -- No abdominal masses appreciated, pulsatile or otherwise  Musculoskeletal and Integumentary:  -- Wounds: None appreciated -- Extremities: B/L UE  and LE FROM, hands and feet warm, no edema  Neurologic:  -- Motor function: intact and symmetric -- Sensation: intact and symmetric   Labs:     Latest Ref Rng & Units 03/07/2023   10:00 AM 02/10/2023    9:05 AM  CBC  WBC 4.0 - 10.5 K/uL 14.4  8.5   Hemoglobin 12.0 - 15.0 g/dL 44.0  34.7   Hematocrit 36.0 - 46.0 % 48.7  45.4   Platelets 150 - 400 K/uL 221  184       Latest Ref Rng & Units 03/07/2023   10:00 AM 02/10/2023    9:05 AM  CMP  Glucose 70 - 99 mg/dL 425  956   BUN 8 - 23 mg/dL 21  19   Creatinine 3.87 - 1.00 mg/dL 5.64  3.32   Sodium 951 - 145 mmol/L 140  136   Potassium 3.5 - 5.1 mmol/L 4.1  3.6   Chloride 98 - 111 mmol/L 99  104   CO2 22 - 32 mmol/L 29  23   Calcium 8.9 - 10.3 mg/dL 9.7  8.4   Total Protein 6.5 - 8.1 g/dL 7.4  6.8   Total Bilirubin 0.3 - 1.2 mg/dL 0.9  0.8   Alkaline Phos 38 - 126 U/L 59  58   AST 15 - 41 U/L 19  21   ALT 0 - 44 U/L 18  15      Imaging studies:  EXAM: CT ABDOMEN AND PELVIS WITH CONTRAST   TECHNIQUE: Multidetector CT imaging of the abdomen and pelvis was performed using the standard protocol following bolus administration of intravenous contrast.   RADIATION  DOSE REDUCTION: This exam was performed according to the departmental dose-optimization program which includes automated exposure control, adjustment of the mA and/or kV according to patient size and/or use of iterative reconstruction technique.   CONTRAST:  80mL OMNIPAQUE IOHEXOL 350 MG/ML SOLN   COMPARISON:  None Available.   FINDINGS: Lower chest: Heart is enlarged in size. There are small patchy infiltrates in both lower lung fields. Left hemidiaphragm is elevated.   Hepatobiliary: There is 11 mm fluid density structure in the right lobe suggesting hepatic cyst. There is no dilation of bile ducts. Gallbladder is distended. There is fluid around the gallbladder. There are high density foci in the lumen of the gallbladder suggesting gallbladder stones.   Pancreas: No focal abnormalities are seen.   Spleen: Unremarkable.   Adrenals/Urinary Tract: Adrenals are unremarkable. There is no hydronephrosis. There are no renal or ureteral stones. There are few subcentimeter low-density foci in the kidneys suggesting possible renal cysts. Urinary bladder is not distended.   Stomach/Bowel: Stomach is unremarkable. Small bowel loops are not dilated. The appendix is not distinctly visualized. In coronal image 46, there is a small caliber tubular structure in the margin of the cecum, possibly normal appendix. There is no pericecal inflammation. There is no significant wall thickening in colon. Scattered diverticula are seen without signs of focal diverticulitis.   Vascular/Lymphatic: Scattered arterial calcifications are seen.   Reproductive: Uterus is not seen.   Other: There is no ascites or pneumoperitoneum. Umbilical/paraumbilical hernia containing fat is seen.   Musculoskeletal: Degenerative changes are noted in lumbar spine, more so at L4-L5 and L5-S1 levels with significant encroachment of neural foramina.   IMPRESSION: Gallbladder is distended with pericholecystic fluid.  Gallbladder stones are seen. Findings suggest acute cholecystitis. Please correlate with clinical and laboratory findings and consider gallbladder sonogram. There is no dilation of  bile ducts.   Small patchy infiltrates are seen in both lower lung fields suggesting subsegmental atelectasis.   There is no evidence of intestinal obstruction or pneumoperitoneum. There is no hydronephrosis.   Cysts seen in the liver and both kidneys. Diverticulosis of colon without signs of focal diverticulitis. Aortic arteriosclerosis. Lumbar spondylosis.     Electronically Signed   By: Ernie Avena M.D.   On: 03/07/2023 12:43  Assessment/Plan:  62 y.o. female with acute cholecystitis, complicated by pertinent comorbidities including elevated troponins, hypoxemia, chest pain, rule out cardiac etiology.  Acute cholecystitis -Patient with physical exam and imaging consistent with acute cholecystitis. -Onset of cholecystitis seems to be at least a week with the fact that patient has had abdominal pain intermittently for the last week or 2.  Imaging also with severe pericholecystic fluid and inflammation. -Discussed with patient that usually cholecystitis is treated with cholecystectomy but due to the amount of inflammation, the onset of the pain and the current concern of cardiac etiology due to her elevated troponin and hypoxemia, it would be safer to treat cholecystitis with antibiotic therapy and percutaneous cholecystostomy.  Interventional radiology was consulted and they recommended to complete cardiac workup before proceeding with cholecystostomy. -Continue to follow along   Gae Gallop, MD

## 2023-03-07 NOTE — Progress Notes (Signed)
CODE SEPSIS - PHARMACY COMMUNICATION  **Broad Spectrum Antibiotics should be administered within 1 hour of Sepsis diagnosis**  Time Code Sepsis Called/Page Received: 1316  Antibiotics Ordered: Zosyn  Time of 1st antibiotic administration: 1343  Additional action taken by pharmacy: None  If necessary, Name of Provider/Nurse Contacted: None    Rockwell Alexandria ,PharmD Clinical Pharmacist  03/07/2023  1:16 PM

## 2023-03-07 NOTE — ED Provider Notes (Signed)
[facility] Provider Note    Event Date/Time   First MD Initiated Contact with Patient 03/07/23 [phone]     (approximate)   History   Chest Pain   HPI  Denise Huffman is a 62 y.o. female with history of hypertension presenting to the ER for evaluation of chest pain.  Last night, patient was eating lasagna when after a few bites she had onset of multiple episodes of vomiting followed by development of right sided lower chest/upper abdominal pain.  No history of similar.  Does additionally report that she has not had a bowel movement for several days.  Today, began to develop some shortness of breath.  With EMS, she was found to have a room air saturation of 86%.  She was given 4 mg of Zofran, 1 nitroglycerin spray, 50 mcg of fentanyl, aspirin.      Physical Exam   Triage Vital Signs: ED Triage Vitals  Encounter Vitals Group     BP 03/07/23 0945 (!) 155/89     Systolic BP Percentile --      Diastolic BP Percentile --      Pulse Rate 03/07/23 0945 71     Resp 03/07/23 0945 (!) 9     Temp 03/07/23 0947 99.1 F (37.3 C)     Temp Source 03/07/23 0947 Oral     SpO2 03/07/23 0947 (!) 89 %     Weight 03/07/23 0948 198 lb 6.6 oz (90 kg)     Height 03/07/23 0948 5\' 9"  (1.753 m)     Head Circumference --      Peak Flow --      Pain Score 03/07/23 0948 10     Pain Loc --      Pain Education --      Exclude from Growth Chart --     Most recent vital signs: Vitals:   03/07/23 1000 03/07/23 1450  BP: (!) 170/104 (!) 149/71  Pulse: 66 66  Resp: 17   Temp:    SpO2: 95% 93%     General: Awake, interactive  CV:  Regular rate, good peripheral perfusion.  Resp:  Lungs clear, unlabored respirations.  Abd:  Soft, nondistended, tender to palpation over the right upper quadrant, remainder of abdomen nontender Neuro:  Symmetric facial movement, fluid speech   ED Results / Procedures / Treatments   Labs (all labs ordered are listed, but only abnormal results  are displayed) Labs Reviewed  CBC WITH DIFFERENTIAL/PLATELET - Abnormal; Notable for the following components:      Result Value   WBC 14.4 (*)    Hemoglobin 16.2 (*)    HCT 48.7 (*)    Neutro Abs 12.5 (*)    Abs Immature Granulocytes 0.09 (*)    All other components within normal limits  COMPREHENSIVE METABOLIC PANEL - Abnormal; Notable for the following components:   Glucose, Bld 147 (*)    All other components within normal limits  URINALYSIS, ROUTINE W REFLEX MICROSCOPIC - Abnormal; Notable for the following components:   Color, Urine YELLOW (*)    APPearance HAZY (*)    Specific Gravity, Urine >1.046 (*)    Protein, ur 30 (*)    Leukocytes,Ua LARGE (*)    Bacteria, UA RARE (*)    All other components within normal limits  TROPONIN I (HIGH SENSITIVITY) - Abnormal; Notable for the following components:   Troponin I (High Sensitivity) 229 (*)    All other components within normal limits  TROPONIN I (HIGH SENSITIVITY) - Abnormal; Notable for the following components:   Troponin I (High Sensitivity) 250 (*)    All other components within normal limits  RESP PANEL BY RT-PCR (RSV, FLU A&B, COVID)  RVPGX2  CULTURE, BLOOD (ROUTINE X 2)  CULTURE, BLOOD (ROUTINE X 2)  LIPASE, BLOOD  LACTIC ACID, PLASMA  PROTIME-INR  LACTIC ACID, PLASMA  HEPARIN LEVEL (UNFRACTIONATED)     EKG EKG independently reviewed interpreted by myself (ER attending) demonstrates:  EKG demonstrate sinus rhythm at a rate of 74, PR 131, QRS 98, QTc 496, no acute ischemic findings  Repeat EKG obtained at 1110 demonstrating sinus rhythm at a rate of 67, PR 132, QRS 95, QTc 499, remains without acute ischemic findings  RADIOLOGY Imaging independently reviewed and interpreted by myself demonstrates:  CTA of the chest without evidence of PE.  Reviewed with radiologist she does not note any inflammatory changes along the esophagus or other findings suggestive of esophageal tear/perforation.  Radiology does note  linear patchy infiltrates in the bilateral lower lungs that could be reflective of atelectasis or pneumonia.    PROCEDURES:  Critical Care performed: Yes, see critical care procedure note(s)  CRITICAL CARE Performed by: Trinna Post   Total critical care time: 41 minutes  Critical care time was exclusive of separately billable procedures and treating other patients.  Critical care was necessary to treat or prevent imminent or life-threatening deterioration.  Critical care was time spent personally by me on the following activities: development of treatment plan with patient and/or surrogate as well as nursing, discussions with consultants, evaluation of patient's response to treatment, examination of patient, obtaining history from patient or surrogate, ordering and performing treatments and interventions, ordering and review of laboratory studies, ordering and review of radiographic studies, pulse oximetry and re-evaluation of patient's condition.   Procedures   MEDICATIONS ORDERED IN ED: Medications  acetaminophen (TYLENOL) tablet 650 mg (has no administration in time range)    Or  acetaminophen (TYLENOL) suppository 650 mg (has no administration in time range)  ondansetron (ZOFRAN) tablet 4 mg (has no administration in time range)    Or  ondansetron (ZOFRAN) injection 4 mg (has no administration in time range)  senna-docusate (Senokot-S) tablet 1 tablet (has no administration in time range)  piperacillin-tazobactam (ZOSYN) IVPB 3.375 g (has no administration in time range)  heparin ADULT infusion 100 units/mL (25000 units/274mL) (1,050 Units/hr Intravenous New Bag/Given 03/07/23 1450)  0.9 %  sodium chloride infusion (has no administration in time range)  morphine (PF) 2 MG/ML injection 2 mg (has no administration in time range)  morphine (PF) 4 MG/ML injection 4 mg (has no administration in time range)  hydrALAZINE (APRESOLINE) injection 5 mg (has no administration in time range)   fentaNYL (SUBLIMAZE) injection 50 mcg (50 mcg Intravenous Given 03/07/23 1039)  iohexol (OMNIPAQUE) 350 MG/ML injection 80 mL (80 mLs Intravenous Contrast Given 03/07/23 1123)  ondansetron (ZOFRAN) injection 4 mg (4 mg Intravenous Given 03/07/23 1233)  morphine (PF) 4 MG/ML injection 4 mg (4 mg Intravenous Given 03/07/23 1347)  sodium chloride 0.9 % bolus 1,000 mL (0 mLs Intravenous Stopped 03/07/23 1452)  piperacillin-tazobactam (ZOSYN) IVPB 3.375 g (0 g Intravenous Stopped 03/07/23 1430)  heparin bolus via infusion 4,000 Units (4,000 Units Intravenous Bolus from Bag 03/07/23 1450)     IMPRESSION / MDM / ASSESSMENT AND PLAN / ED COURSE  I reviewed the triage vital signs and the nursing notes.  Differential diagnosis includes, but is not limited to, pulmonary  embolism, esophageal perforation, ACS, pneumonia, pneumothorax, biliary pathology, bowel obstruction, other acute intra-abdominal process  Patient's presentation is most consistent with acute presentation with potential threat to life or bodily function.  62 year old female presenting to the emergency department for evaluation of vomiting and chest pain.  Hypoxic on presentation with normal heart rate.  Broad initial differential.  Lab work notable for leukocytosis with WC of 14.4, CMP without severe derangements.  Normal lipase.  Urine somewhat concerning for infection with large leukocyte esterase, 21-50 white blood cells, rare bacteria.  Her initial troponin is elevated at 229, this did increase to 250.  She has already received pain.  CT a of the chest is without evidence of PE.  Possible mild pneumonia noted.  CT abdomen pelvis does demonstrate cholecystitis, suspect that this is likely the cause of patient's right sided discomfort, overall lower suspicion for ACS.  Will defer heparin, particularly given possible procedural intervention.  Unclear cause of patient's hypoxia, possibly related to mild pneumonia seen on x-Kalyssa Anker.  Sepsis orders were  initiated with empiric Zosyn.  Case was reviewed with Dr. Maia Plan with general surgery.  He did recommend medicine admission for her hypoxia and elevated troponin, IV antibiotics for her cholecystitis, with continued evaluation to determine further intervention.  Will reach out to hospitalist team.  Case discussed with Dr. Sedalia Muta.  She will evaluate the patient for anticipated admission.  Clinical Course as of 03/07/23 1532  Fri Mar 07, 2023  1312 CT demonstrates concerning findings for acute cholecystitis, possible pneumonia.  Identification of question sepsis at this time, will initiate sepsis orders. [NR]    Clinical Course User Index [NR] Trinna Post, MD     FINAL CLINICAL IMPRESSION(S) / ED DIAGNOSES   Final diagnoses:  Acute cholecystitis  Hypoxia  Elevated troponin     Rx / DC Orders   ED Discharge Orders     None        Note:  This document was prepared using Dragon voice recognition software and may include unintentional dictation errors.   Trinna Post, MD 03/07/23 [phone]

## 2023-03-07 NOTE — Assessment & Plan Note (Signed)
Protonix 40 mg IV twice daily, 3 doses ordered

## 2023-03-07 NOTE — ED Notes (Addendum)
ERP notified of pt critical troponin. See orders.

## 2023-03-07 NOTE — Assessment & Plan Note (Signed)
Etiology workup in progress, EKG was negative for ischemic changes and patient does not have history of cardiac history however given patient's age and medical history of hypertension and hyperlipidemia, we will proceed with cardiac workup including a complete echo at this time Heparin and per pharmacy has been ordered AM team to consult cardiology pending complete echo

## 2023-03-07 NOTE — Assessment & Plan Note (Signed)
Present on admission, UA was positive for large leukocytes on admission Continue Zosyn per pharmacy

## 2023-03-07 NOTE — ED Provider Notes (Signed)
[facility]    CSN: [id number] Arrival date & time: 03/07/23  0831      History   Chief Complaint Chief Complaint  Patient presents with   Chest Pain   Dizziness    HPI Denise Huffman is a 62 y.o. female presents due to waking up with N/V and vertigo and has been vomiting all night. Has R chest pain and feels SOB. She denies sweats, L chest or arm pain. Denies hx of any cardiac conditions. Has not injured herself in any way.     Past Medical History:  Diagnosis Date   Anxiety    Asthma    Depression    GERD (gastroesophageal reflux disease)    Hyperlipidemia    Hypertension    Insomnia    Vertigo     Patient Active Problem List   Diagnosis Date Noted   Hypertension 04/17/2018   Pain in both lower extremities 04/01/2018   Bilateral lower extremity edema 04/01/2018    Past Surgical History:  Procedure Laterality Date   ABDOMINAL HYSTERECTOMY     BACK SURGERY      OB History   No obstetric history on file.      Home Medications    Prior to Admission medications   Medication Sig Start Date End Date Taking? Authorizing Provider  traMADol (ULTRAM) 50 MG tablet  03/27/18  Yes [provider]  albuterol (PROVENTIL HFA;VENTOLIN HFA) 108 (90 BASE) MCG/ACT inhaler Inhale into the lungs every 6 (six) hours as needed for wheezing or shortness of breath.    [provider]  amLODipine (NORVASC) 10 MG tablet Take 1 tablet by mouth daily. 08/30/22 08/30/23  [provider]  ARIPiprazole (ABILIFY) 10 MG tablet Take by mouth. 02/06/23   [provider]  atorvastatin (LIPITOR) 20 MG tablet Take 1 tablet by mouth daily. 05/14/22 05/14/23  [provider]  beclomethasone (QVAR) 80 MCG/ACT inhaler Inhale into the lungs 2 (two) times daily.    [provider]  buPROPion (WELLBUTRIN XL) 150 MG 24 hr tablet Take 1 tablet by mouth every morning. 09/27/20 10/07/23  [provider]  cetirizine (ZYRTEC) 10 MG tablet Take  10 mg by mouth daily.    [provider]  dicyclomine (BENTYL) 20 MG tablet Take 20 mg by mouth every 6 (six) hours.    [provider]  lisinopril-hydrochlorothiazide (PRINZIDE,ZESTORETIC) 20-25 MG tablet  02/18/18   [provider]  omeprazole (PRILOSEC) 20 MG capsule Take 20 mg by mouth daily.    [provider]  traZODone (DESYREL) 100 MG tablet Take 100 mg by mouth at bedtime.    [provider]  triamcinolone ointment (KENALOG) 0.5 % Apply 1 application topically 2 (two) times daily. 01/19/19   Everlene Other G, DO  ipratropium (ATROVENT) 0.06 % nasal spray Place 2 sprays into both nostrils 4 (four) times daily.  12/11/19  [provider]    Family History History reviewed. No pertinent family history.  Social History Social History   Tobacco Use   Smoking status: Never   Smokeless tobacco: Never  Vaping Use   Vaping status: Never Used  Substance Use Topics   Alcohol use: No   Drug use: No     Allergies   Patient has no known allergies.   Review of Systems Review of Systems As noted in HPI  Physical Exam Triage Vital Signs ED Triage Vitals  Encounter Vitals Group     BP 03/07/23 0850 (!) 163/115  Systolic BP Percentile --      Diastolic BP Percentile --      Pulse Rate 03/07/23 0850 88     Resp 03/07/23 0850 15     Temp 03/07/23 0850 98.5 F (36.9 C)     Temp Source 03/07/23 0850 Oral     SpO2 03/07/23 0850 (!) 88 %     Weight 03/07/23 0848 199 lb 15.3 oz (90.7 kg)     Height 03/07/23 0848 5\' 9"  (1.753 m)     Head Circumference --      Peak Flow --      Pain Score 03/07/23 0848 10     Pain Loc --      Pain Education --      Exclude from Growth Chart --    No data found.  Updated Vital Signs BP (!) 163/115 (BP Location: Left Arm)   Pulse 88   Temp 98.5 F (36.9 C) (Oral)   Resp 15   Ht 5\' 9"  (1.753 m)   Wt 199 lb 15.3 oz (90.7 kg)   SpO2 94%   BMI 29.53 kg/m   Visual Acuity Right Eye  Distance:   Left Eye Distance:   Bilateral Distance:    Right Eye Near:   Left Eye Near:    Bilateral Near:     Physical Exam Vitals and nursing note reviewed.  Constitutional:      General: She is in acute distress.     Appearance: She is obese. She is ill-appearing. She is not toxic-appearing or diaphoretic.  HENT:     Right Ear: External ear normal.     Left Ear: External ear normal.  Eyes:     General: No scleral icterus.    Conjunctiva/sclera: Conjunctivae normal.  Cardiovascular:     Heart sounds: Normal heart sounds.  Pulmonary:     Breath sounds: Normal breath sounds.     Comments: She is breathing a little heavy, but is able to complete full sentences.  Has mild crackles on RLL Has R anterior rib tenderness under her R breast, which reproduced her chest pain.  Chest:     Chest wall: Tenderness present.  Musculoskeletal:        General: Normal range of motion.  Skin:    General: Skin is warm and dry.     Findings: No rash.  Neurological:     Mental Status: She is alert and oriented to person, place, and time.     Gait: Gait normal.  Psychiatric:        Mood and Affect: Mood normal.        Behavior: Behavior normal.        Thought Content: Thought content normal.        Judgment: Judgment normal.      UC Treatments / Results  Labs (all labs ordered are listed, but only abnormal results are displayed) Labs Reviewed - No data to display  EKG  Abnormal with ST depression changes in V4, V5 which were not present on prior EKG from 10/2021 Radiology DG Chest 1 View  Result Date: 03/07/2023 CLINICAL DATA:  Shortness of breath.  Chest pain EXAM: CHEST  1 VIEW COMPARISON:  Chest radiographs 06/20/2015 FINDINGS: Cardiac silhouette is at the upper limits of normal size, similar to prior. Mediastinal contours are within normal limits. Mild bilateral lower lung linear likely subsegmental atelectasis. No focal airspace opacity to indicate pneumonia. No pulmonary edema,  pleural effusion, or pneumothorax. No acute skeletal abnormality.  IMPRESSION: Mild bilateral lower lung linear likely subsegmental atelectasis. Electronically Signed   By: Neita Garnet M.D.   On: 03/07/2023 11:26    Procedures Procedures (including critical care time)  Medications Ordered in UC Medications  ondansetron (ZOFRAN-ODT) disintegrating tablet 8 mg (8 mg Oral Given 03/07/23 0900)    Initial Impression / Assessment and Plan / UC Course  I have reviewed the triage vital signs and the nursing notes. She was given Zofran ODT 8 mg SL EMS was called and she was placed on O2 2L which raised her pulse ox to 94% We were going to give her ASA, but EMS said they would give it to her.    Final Clinical Impressions(s) / UC Diagnoses   Final diagnoses:  Nonspecific abnormal electrocardiogram (ECG) (EKG)  Chest wall pain  Hypoxemia  Nausea and vomiting, unspecified vomiting type  Dizziness   Discharge Instructions   None    ED Prescriptions   None    PDMP not reviewed this encounter.   Garey Ham, PA-C 03/07/23 1233

## 2023-03-07 NOTE — ED Notes (Signed)
Hospitalist messaged to verify heparin order. Per hospitalist start heparin even though pt is set to go to IR at some point.

## 2023-03-07 NOTE — ED Notes (Signed)
Patient is being discharged from the Urgent Care and sent to the Emergency Department via EMS . Per Durene Fruits, patient is in need of higher level of care due to Chest pain, shortness of breath. Patient is aware and verbalizes understanding of plan of care.  Vitals:   03/07/23 0850 03/07/23 0855  BP: (!) 163/115   Pulse: 88   Resp: 15   Temp: 98.5 F (36.9 C)   SpO2: (!) 88% 94%

## 2023-03-07 NOTE — Progress Notes (Signed)
ANTICOAGULATION CONSULT NOTE - Initial Consult  Pharmacy Consult for heparin Indication: chest pain/ACS  No Known Allergies  Patient Measurements: Height: 5\' 9"  (175.3 cm) Weight: 90 kg (198 lb 6.6 oz) IBW/kg (Calculated) : 66.2 Heparin Dosing Weight: 84.9 kg  Vital Signs: Temp: 99.1 F (37.3 C) (08/09 0947) Temp Source: Oral (08/09 0947) BP: 170/104 (08/09 1000) Pulse Rate: 66 (08/09 1000)  Labs: Recent Labs    03/07/23 1000 03/07/23 1203  HGB 16.2*  --   HCT 48.7*  --   PLT 221  --   CREATININE 0.75  --   TROPONINIHS 229* 250*    Estimated Creatinine Clearance: 87.1 mL/min (by C-G formula based on SCr of 0.75 mg/dL).   Medical History: Past Medical History:  Diagnosis Date   Anxiety    Asthma    Depression    GERD (gastroesophageal reflux disease)    Hyperlipidemia    Hypertension    Insomnia    Vertigo    Assessment: 62 y/o female presenting with chest pain and shortness of breath. Pharmacy has been consulted to start heparin infusion. Patient is not on anticoagulation prior to admission. Hgb 16.2, plt WNL - no signs/symptoms of bleeding noted.  Goal of Therapy:  Heparin level 0.3-0.7 units/ml Monitor platelets by anticoagulation protocol: Yes   Plan:  Give heparin 4000 unit bolus x1 Start heparin infusion at 1050 units/hour Check 6-hour Anti-Xa level Monitor daily Anti-Xa levels while on heparin infusion Monitor CBC and signs/symptoms of bleeding  Thank you for involving pharmacy in this patient's care.   Rockwell Alexandria, PharmD Clinical Pharmacist 03/07/2023 2:29 PM

## 2023-03-07 NOTE — Assessment & Plan Note (Signed)
Home bupropion 150 mg every morning, trazodone 100 mg nightly, Abilify 10 mg daily were resumed on admission

## 2023-03-07 NOTE — ED Triage Notes (Signed)
Pt from urgent care, EMS was called for possible MI with chest pain and headache. EMS did not see that. Pt was given 4 mg zofran at urgent care, 1 nitroglycerin spray, 50 mcg fentanyl. Pt reports chest pain starting last night after eating dinner. Pt is 86% on RA, pt placed on 3 L.

## 2023-03-07 NOTE — Progress Notes (Signed)
ANTICOAGULATION CONSULT NOTE   Pharmacy Consult for Heparin Infusion Indication: chest pain/ACS  No Known Allergies  Patient Measurements: Height: 5\' 9"  (175.3 cm) Weight: 90 kg (198 lb 6.6 oz) IBW/kg (Calculated) : 66.2 Heparin Dosing Weight: 84.9 kg  Vital Signs: Temp: 97.7 F (36.5 C) (08/09 2022) Temp Source: Oral (08/09 1946) BP: 154/72 (08/09 2022) Pulse Rate: 70 (08/09 2022)  Labs: Recent Labs    03/07/23 1000 03/07/23 1203 03/07/23 1434 03/07/23 1656 03/07/23 1915 03/07/23 2104  HGB 16.2*  --   --   --   --   --   HCT 48.7*  --   --   --   --   --   PLT 221  --   --   --   --   --   LABPROT  --   --  13.9  --   --   --   INR  --   --  1.1  --   --   --   HEPARINUNFRC  --   --   --   --   --  <0.10*  CREATININE 0.75  --   --   --   --   --   TROPONINIHS 229* 250*  --  404* 370*  --     Estimated Creatinine Clearance: 87.1 mL/min (by C-G formula based on SCr of 0.75 mg/dL).   Medical History: Past Medical History:  Diagnosis Date   Anxiety    Asthma    Depression    GERD (gastroesophageal reflux disease)    Hyperlipidemia    Hypertension    Insomnia    Vertigo    Assessment: Denise Huffman is a 62 y.o. female presenting with chest pain and SOB. PMH significant for HTN, HLD, GERD, depression. Patient was not on [facility] PTA per chart review. Pharmacy has been consulted to initiate and manage heparin infusion.   Baseline Labs: PT 13.9, INR 1.1, Hgb 16.2, Hct 48.7, Plt 221   Goal of Therapy:  Heparin level 0.3-0.7 units/ml Monitor platelets by anticoagulation protocol: Yes   Date Time HL Rate/Comment  8/9 2104 <0.10 1050/subtherapeutic  Plan:  Per RN, heparin infusion running without issue today Give 2550 units bolus x 1 Increase heparin infusion to 1350 units/hr Check HL in 6 hours  Continue to monitor H&H and platelets daily while on heparin infusion   Thank you for involving pharmacy in this patient's care.   Celene Squibb, PharmD Clinical  Pharmacist 03/07/2023 10:02 PM

## 2023-03-07 NOTE — Hospital Course (Signed)
Ms. Aberdeen Mescall is a 62 year old female with history of GERD, hyperlipidemia, depression, hypertension, who presents emergency department for chief concerns of vomiting, chest pain, right upper quadrant abdominal pain.  Patient initially presented to urgent care center at [facility] and was transferred to ED for higher acuity of care.  Vitals in the ED showed temperature of 99.1, respiration rate of 17, heart rate of 66, blood pressure 170/104, SpO2 of 95% on 3 L nasal cannula.  Serum sodium is 140, potassium 4.1, chloride 99, bicarb 29, BUN of 21, serum creatinine of 2.75, EGFR greater than 60, nonfasting blood glucose 167, WBC 14.4, hemoglobin 16.2, platelets of 221.  High sensitive troponin was initially 220, and on repeat was 250.  UA was positive for large leukocytes.  ED treatment: Piperacillin-tazobactam IV one-time dose, sodium chloride 1 L bolus, morphine 4 mg IV one-time dose, ondansetron 4 mg IV one-time dose, fentanyl 50 mcg IV one-time dose.

## 2023-03-07 NOTE — Assessment & Plan Note (Addendum)
Home lisinopril-hydrochlorothiazide 20-25 mg daily, amlodipine 10 mg daily resumed Hydralazine 5 mg IV every 6 hours as needed for SBP greater than 170, 5 days ordered

## 2023-03-07 NOTE — Consult Note (Signed)
Chief Complaint: Patient was seen in consultation today for acute cholecystitis  Referring Physician(s): Amy Homeyer, DO  Supervising Physician: Pernell Dupre  Patient Status: [facility] - ED  History of Present Illness: Denise Huffman is a 62 y.o. female with PMH significant for anxiety, asthma, depression, GERD, hyperlipidemia, hypertension, and vertigo being seen today in relation to acute cholecystitis. Patient states she began experiencing abdominal pain last night, along with severe nausea and vomiting. She also endorses SOB and mild left and right sided chest pain. CT today revealed distended gallbladder with stones concerning for acute cholecystitis. IR consulted for possible percutaneous cholecystostomy placement.  Past Medical History:  Diagnosis Date   Anxiety    Asthma    Depression    GERD (gastroesophageal reflux disease)    Hyperlipidemia    Hypertension    Insomnia    Vertigo     Past Surgical History:  Procedure Laterality Date   ABDOMINAL HYSTERECTOMY     BACK SURGERY      Allergies: Patient has no known allergies.  Medications: Prior to Admission medications   Medication Sig Start Date End Date Taking? Authorizing Provider  albuterol (PROVENTIL HFA;VENTOLIN HFA) 108 (90 BASE) MCG/ACT inhaler Inhale into the lungs every 6 (six) hours as needed for wheezing or shortness of breath.    [provider]  amLODipine (NORVASC) 10 MG tablet Take 1 tablet by mouth daily. 08/30/22 08/30/23  [provider]  ARIPiprazole (ABILIFY) 10 MG tablet Take by mouth. 02/06/23   [provider]  atorvastatin (LIPITOR) 20 MG tablet Take 1 tablet by mouth daily. 05/14/22 05/14/23  [provider]  beclomethasone (QVAR) 80 MCG/ACT inhaler Inhale into the lungs 2 (two) times daily.    [provider]  buPROPion (WELLBUTRIN XL) 150 MG 24 hr tablet Take 1 tablet by mouth every morning. 09/27/20 10/07/23  [provider]  cetirizine (ZYRTEC)  10 MG tablet Take 10 mg by mouth daily.    [provider]  dicyclomine (BENTYL) 20 MG tablet Take 20 mg by mouth every 6 (six) hours.    [provider]  lisinopril-hydrochlorothiazide (PRINZIDE,ZESTORETIC) 20-25 MG tablet  02/18/18   [provider]  omeprazole (PRILOSEC) 20 MG capsule Take 20 mg by mouth daily.    [provider]  traMADol Janean Sark) 50 MG tablet  03/27/18   [provider]  traZODone (DESYREL) 100 MG tablet Take 100 mg by mouth at bedtime.    [provider]  triamcinolone ointment (KENALOG) 0.5 % Apply 1 application topically 2 (two) times daily. 01/19/19   Everlene Other G, DO  ipratropium (ATROVENT) 0.06 % nasal spray Place 2 sprays into both nostrils 4 (four) times daily.  12/11/19  [provider]     History reviewed. No pertinent family history.  Social History   Socioeconomic History   Marital status: Single    Spouse name: Not on file   Number of children: Not on file   Years of education: Not on file   Highest education level: Not on file  Occupational History   Not on file  Tobacco Use   Smoking status: Never   Smokeless tobacco: Never  Vaping Use   Vaping status: Never Used  Substance and Sexual Activity   Alcohol use: No   Drug use: No   Sexual activity: Not on file  Other Topics Concern   Not on file  Social History Narrative   Not on file   Social Determinants of Health  Financial Resource Strain: Low Risk  (11/26/2022)   Received from [facility]   Overall Financial Resource Strain (CARDIA)    Difficulty of Paying Living Expenses: Not hard at all  Food Insecurity: No Food Insecurity (11/26/2022)   Received from [facility]   Hunger Vital Sign    Worried About Running Out of Food in the Last Year: Never true    Ran Out of Food in the Last Year: Never true  Transportation Needs: No Transportation Needs (11/26/2022)   Received from [facility] - Transportation     Lack of Transportation (Medical): No    Lack of Transportation (Non-Medical): No  Physical Activity: Insufficiently Active (02/09/2021)   Received from [facility]   Exercise Vital Sign    Days of Exercise per Week: 2 days    Minutes of Exercise per Session: 20 min  Stress: Not on file  Social Connections: Not on file    Code Status: Full Code  Review of Systems: A 12 point ROS discussed and pertinent positives are indicated in the HPI above.  All other systems are negative.  Review of Systems  Constitutional:  Positive for chills and fever.  Respiratory:  Positive for shortness of breath. Negative for chest tightness.   Cardiovascular:  Positive for chest pain. Negative for leg swelling.  Gastrointestinal:  Positive for abdominal pain, nausea and vomiting. Negative for diarrhea.  Neurological:  Positive for dizziness and headaches.       Patient reports she has dizziness from chronic vertigo  Psychiatric/Behavioral:  Negative for confusion.     Vital Signs: BP (!) 170/104   Pulse 66   Temp 99.1 F (37.3 C) (Oral)   Resp 17   Ht 5\' 9"  (1.753 m)   Wt 198 lb 6.6 oz (90 kg)   SpO2 95%   BMI 29.30 kg/m     Physical Exam Vitals reviewed.  Constitutional:      General: She is not in acute distress.    Appearance: She is ill-appearing.  Cardiovascular:     Rate and Rhythm: Normal rate and regular rhythm.     Pulses: Normal pulses.     Heart sounds: Normal heart sounds.  Pulmonary:     Effort: Pulmonary effort is normal.     Breath sounds: Normal breath sounds.  Abdominal:     Palpations: Abdomen is soft.     Tenderness: There is abdominal tenderness.  Musculoskeletal:     Right lower leg: No edema.     Left lower leg: No edema.  Skin:    General: Skin is warm and dry.  Neurological:     Mental Status: She is alert and oriented to person, place, and time.  Psychiatric:        Mood and Affect: Mood normal.        Behavior: Behavior normal.        Thought  Content: Thought content normal.        Judgment: Judgment normal.     Imaging: CT Angio Chest PE W and/or Wo Contrast  Result Date: 03/07/2023 CLINICAL DATA:  Right chest pain, hypoxia EXAM: CT ANGIOGRAPHY CHEST WITH CONTRAST TECHNIQUE: Multidetector CT imaging of the chest was performed using the standard protocol during bolus administration of intravenous contrast. Multiplanar CT image reconstructions and MIPs were obtained to evaluate the vascular anatomy. RADIATION DOSE REDUCTION: This exam was performed according to the departmental dose-optimization program which includes automated exposure control, adjustment of the  mA and/or kV according to patient size and/or use of iterative reconstruction technique. CONTRAST:  80mL OMNIPAQUE IOHEXOL 350 MG/ML SOLN COMPARISON:  Chest radiograph done today FINDINGS: Cardiovascular: There are no intraluminal filling defects in pulmonary artery branches. There is homogeneous enhancement in thoracic aorta. Scattered calcifications are seen in thoracic aorta. Small scattered coronary artery calcifications are seen. Heart is enlarged in size. Mediastinum/Nodes: No significant lymphadenopathy is seen. There are no inflammatory changes adjacent to the thoracic esophagus. Lungs/Pleura: There are small linear patchy infiltrates in both lower lung fields. Rest of the lung fields are clear. There is no significant pleural effusion. There is no pneumothorax. Upper Abdomen: Please refer to the report for CT abdomen and pelvis. Musculoskeletal: No acute findings are seen. Review of the MIP images confirms the above findings. IMPRESSION: There is no evidence of pulmonary embolism. There is no evidence of thoracic aortic dissection. Aortic arteriosclerosis. Small calcifications are seen in coronary artery branches. There are small linear patchy infiltrates in both lower lung fields suggesting atelectasis/pneumonia. Electronically Signed   By: Ernie Avena M.D.   On:  03/07/2023 12:47   CT ABDOMEN PELVIS W CONTRAST  Result Date: 03/07/2023 CLINICAL DATA:  Acute right-sided abdominal pain, vomiting EXAM: CT ABDOMEN AND PELVIS WITH CONTRAST TECHNIQUE: Multidetector CT imaging of the abdomen and pelvis was performed using the standard protocol following bolus administration of intravenous contrast. RADIATION DOSE REDUCTION: This exam was performed according to the departmental dose-optimization program which includes automated exposure control, adjustment of the mA and/or kV according to patient size and/or use of iterative reconstruction technique. CONTRAST:  80mL OMNIPAQUE IOHEXOL 350 MG/ML SOLN COMPARISON:  None Available. FINDINGS: Lower chest: Heart is enlarged in size. There are small patchy infiltrates in both lower lung fields. Left hemidiaphragm is elevated. Hepatobiliary: There is 11 mm fluid density structure in the right lobe suggesting hepatic cyst. There is no dilation of bile ducts. Gallbladder is distended. There is fluid around the gallbladder. There are high density foci in the lumen of the gallbladder suggesting gallbladder stones. Pancreas: No focal abnormalities are seen. Spleen: Unremarkable. Adrenals/Urinary Tract: Adrenals are unremarkable. There is no hydronephrosis. There are no renal or ureteral stones. There are few subcentimeter low-density foci in the kidneys suggesting possible renal cysts. Urinary bladder is not distended. Stomach/Bowel: Stomach is unremarkable. Small bowel loops are not dilated. The appendix is not distinctly visualized. In coronal image 46, there is a small caliber tubular structure in the margin of the cecum, possibly normal appendix. There is no pericecal inflammation. There is no significant wall thickening in colon. Scattered diverticula are seen without signs of focal diverticulitis. Vascular/Lymphatic: Scattered arterial calcifications are seen. Reproductive: Uterus is not seen. Other: There is no ascites or  pneumoperitoneum. Umbilical/paraumbilical hernia containing fat is seen. Musculoskeletal: Degenerative changes are noted in lumbar spine, more so at L4-L5 and L5-S1 levels with significant encroachment of neural foramina. IMPRESSION: Gallbladder is distended with pericholecystic fluid. Gallbladder stones are seen. Findings suggest acute cholecystitis. Please correlate with clinical and laboratory findings and consider gallbladder sonogram. There is no dilation of bile ducts. Small patchy infiltrates are seen in both lower lung fields suggesting subsegmental atelectasis. There is no evidence of intestinal obstruction or pneumoperitoneum. There is no hydronephrosis. Cysts seen in the liver and both kidneys. Diverticulosis of colon without signs of focal diverticulitis. Aortic arteriosclerosis. Lumbar spondylosis. Electronically Signed   By: Ernie Avena M.D.   On: 03/07/2023 12:43   DG Chest 1 View  Result Date: 03/07/2023  CLINICAL DATA:  Shortness of breath.  Chest pain EXAM: CHEST  1 VIEW COMPARISON:  Chest radiographs 06/20/2015 FINDINGS: Cardiac silhouette is at the upper limits of normal size, similar to prior. Mediastinal contours are within normal limits. Mild bilateral lower lung linear likely subsegmental atelectasis. No focal airspace opacity to indicate pneumonia. No pulmonary edema, pleural effusion, or pneumothorax. No acute skeletal abnormality. IMPRESSION: Mild bilateral lower lung linear likely subsegmental atelectasis. Electronically Signed   By: Neita Garnet M.D.   On: 03/07/2023 11:26    Labs:  CBC: Recent Labs    02/10/23 0905 03/07/23 1000  WBC 8.5 14.4*  HGB 15.7* 16.2*  HCT 45.4 48.7*  PLT 184 221    COAGS: No results for input(s): "INR", "APTT" in the last 8760 hours.  BMP: Recent Labs    02/10/23 0905 03/07/23 1000  NA 136 140  K 3.6 4.1  CL 104 99  CO2 23 29  GLUCOSE 132* 147*  BUN 19 21  CALCIUM 8.4* 9.7  CREATININE 0.78 0.75  GFRNONAA >60 >60     LIVER FUNCTION TESTS: Recent Labs    02/10/23 0905 03/07/23 1000  BILITOT 0.8 0.9  AST 21 19  ALT 15 18  ALKPHOS 58 59  PROT 6.8 7.4  ALBUMIN 3.7 4.1    TUMOR MARKERS: No results for input(s): "AFPTM", "CEA", "CA199", "CHROMGRNA" in the last 8760 hours.  Assessment and Plan:  Denise Huffman is a 62 yo female being seen today in relation to acute cholecystitis. Patient reports bilateral chest pain for which EMS gave her 324 mg of ASA. Patient undergoing further cardiac work-up at this time. Patient has had case and imaging reviewed by Dr Juliette Alcide and has been tentatively approved for image-guided percutaneous cholecystostomy placement pending further cardiac work-up. Patient to remain NPO. Appropriate pre-procedural labs have been ordered.   Risks and benefits discussed with the patient including, but not limited to bleeding, infection, gallbladder perforation, bile leak, sepsis or even death.  All of the patient's questions were answered, patient is agreeable to proceed. Consent signed and in chart.   Thank you for this interesting consult.  I greatly enjoyed meeting Malkah Whalon Sigala and look forward to participating in their care.  A copy of this report was sent to the requesting provider on this date.  Electronically Signed: Kennieth Francois, PA-C 03/07/2023, 2:30 PM   I spent a total of 20 Minutes    in face to face in clinical consultation, greater than 50% of which was counseling/coordinating care for acute cholecystitis

## 2023-03-07 NOTE — H&P (Signed)
History and Physical   Denise Huffman WGN:[id number] DOB: 1961-02-05 DOA: 03/07/2023  PCP: [facility]  Patient coming from: Urgent care center via EMS  I have personally briefly reviewed patient's old medical records in [facility] EMR.  Chief Concern: Abdominal pain, nausea, vomiting, chest pain  HPI: Denise Huffman is a 62 year old female with history of GERD, hyperlipidemia, depression, hypertension, who presents emergency department for chief concerns of vomiting, chest pain, right upper quadrant abdominal pain.  Patient initially presented to urgent care center at [facility] and was transferred to ED for higher acuity of care.  Vitals in the ED showed temperature of 99.1, respiration rate of 17, heart rate of 66, blood pressure 170/104, SpO2 of 95% on 3 L nasal cannula.  Serum sodium is 140, potassium 4.1, chloride 99, bicarb 29, BUN of 21, serum creatinine of 2.75, EGFR greater than 60, nonfasting blood glucose 167, WBC 14.4, hemoglobin 16.2, platelets of 221.  High sensitive troponin was initially 220, and on repeat was 250.  UA was positive for large leukocytes.  ED treatment: Piperacillin-tazobactam IV one-time dose, sodium chloride 1 L bolus, morphine 4 mg IV one-time dose, ondansetron 4 mg IV one-time dose, fentanyl 50 mcg IV one-time dose. ------------------------------- At bedside, she is able to tell me her name, age, current location, current calendar.   She reports the symptom that is bothering her the most right now is the right sided upper abdominal pain.  This started when she was enjoying lasanga last evening. She reports that over the last two months, she has been experiencing right upper quadrant pain after eating. She endorses associated generalized fatigue over the last two months.  She took 2 bites of lasagna last evening, and promptly vomited up the lasagna, and continued to vomit green and yellow bile all evening.   She endorses epigastric pain.  She denies  substernal or left-sided chest pain with me.  She denies fever, substernal chest pain, shortness of breath, swelling of her legs, recent sick contacts. She denies dysuria, hematuria, diarrhea.   Social history: She lives at home together with her daughter and her grandson. She dneies tobacco, etoh, and recreational drug use. She works as a Office manager.   ROS: Constitutional: no weight change, no fever ENT/Mouth: no sore throat, no rhinorrhea Eyes: no eye pain, no vision changes Cardiovascular: no chest pain, no dyspnea,  no edema, no palpitations Respiratory: no cough, no sputum, no wheezing Gastrointestinal: + nausea, + vomiting, no diarrhea, no constipation Genitourinary: no urinary incontinence, no dysuria, no hematuria Musculoskeletal: no arthralgias, no myalgias Skin: no skin lesions, no pruritus, Neuro: + weakness, no loss of consciousness, no syncope Psych: no anxiety, no depression, + decrease appetite Heme/Lymph: no bruising, no bleeding  ED Course: Discussed with emergency medicine provider, patient requiring hospitalization for chief concerns of acute cholecystitis and elevated troponin level.  Assessment/Plan  Principal Problem:   Acute cholecystitis Active Problems:   Hypertension   Elevated troponin   Hyperlipidemia   Leukocytosis   GERD (gastroesophageal reflux disease)   Depression   Pyuria   Assessment and Plan:  * Acute cholecystitis Patient does not meet sepsis criteria however with elevated leukocytosis and source of infection, blood cultures x 2 were ordered in the ED and are in progress Continue with Zosyn per pharmacy for intra-abdominal infection Symptomatic support: Morphine 2 mg IV every 4 hours as needed for moderate pain, 2 days ordered; morphine 4 mg IV every 4 hours as needed  for severe pain, 20 hours of coverage ordered EDP consulted with general surgery and interventional radiology Discussed with IR, IR specialist states that IR  service will be available over the weekend however given patient's elevated troponin, agree with cardiac workup first No definitive plan for interventional radiology procedure at this time pending hospitalist cardiac workup, a.m. team to contact IR specialist on-call once cardiac workup has been completed Sodium chloride infusion at 100 mL/h, 1 day ordered Recheck CBC in the a.m.  Pyuria Present on admission, UA was positive for large leukocytes on admission Continue Zosyn per pharmacy  Depression Home bupropion 150 mg every morning, trazodone 100 mg nightly, Abilify 10 mg daily were resumed on admission  GERD (gastroesophageal reflux disease) Protonix 40 mg IV twice daily, 3 doses ordered  Leukocytosis Suspect secondary to acute cholecystitis, treat per above Recheck CBC in the a.m.  Hyperlipidemia Holding home anticholesterol medications on admission  Elevated troponin Etiology workup in progress, EKG was negative for ischemic changes and patient does not have history of cardiac history however given patient's age and medical history of hypertension and hyperlipidemia, we will proceed with cardiac workup including a complete echo at this time Heparin and per pharmacy has been ordered AM team to consult cardiology pending complete echo  Hypertension Home lisinopril-hydrochlorothiazide 20-25 mg daily, amlodipine 10 mg daily resumed Hydralazine 5 mg IV every 6 hours as needed for SBP greater than 170, 5 days ordered  Chart reviewed.   DVT prophylaxis: Heparin per pharmacy Code Status: Full code Diet: Heart healthy at this time; n.p.o. after midnight Family Communication: No Disposition Plan: Pending clinical course including cardiac workup and IR/general surgery evaluation Consults called: EDP consulted general surgery and interventional radiology Admission status: Telemetry cardiac, inpatient  Past Medical History:  Diagnosis Date   Anxiety    Asthma    Depression     GERD (gastroesophageal reflux disease)    Hyperlipidemia    Hypertension    Insomnia    Vertigo    Past Surgical History:  Procedure Laterality Date   ABDOMINAL HYSTERECTOMY     BACK SURGERY     Social History:  reports that she has never smoked. She has never used smokeless tobacco. She reports that she does not drink alcohol and does not use drugs.  No Known Allergies History reviewed. No pertinent family history. Family history: Family history reviewed and not pertinent.  Prior to Admission medications   Medication Sig Start Date End Date Taking? Authorizing Provider  albuterol (PROVENTIL HFA;VENTOLIN HFA) 108 (90 BASE) MCG/ACT inhaler Inhale into the lungs every 6 (six) hours as needed for wheezing or shortness of breath.    [provider]  amLODipine (NORVASC) 10 MG tablet Take 1 tablet by mouth daily. 08/30/22 08/30/23  [provider]  ARIPiprazole (ABILIFY) 10 MG tablet Take by mouth. 02/06/23   [provider]  atorvastatin (LIPITOR) 20 MG tablet Take 1 tablet by mouth daily. 05/14/22 05/14/23  [provider]  beclomethasone (QVAR) 80 MCG/ACT inhaler Inhale into the lungs 2 (two) times daily.    [provider]  buPROPion (WELLBUTRIN XL) 150 MG 24 hr tablet Take 1 tablet by mouth every morning. 09/27/20 10/07/23  [provider]  cetirizine (ZYRTEC) 10 MG tablet Take 10 mg by mouth daily.    [provider]  dicyclomine (BENTYL) 20 MG tablet Take 20 mg by mouth every 6 (six) hours.    [provider]  lisinopril-hydrochlorothiazide (PRINZIDE,ZESTORETIC) 20-25 MG tablet  02/18/18  [provider]  omeprazole (PRILOSEC) 20 MG capsule Take 20 mg by mouth daily.    [provider]  traMADol Janean Sark) 50 MG tablet  03/27/18   [provider]  traZODone (DESYREL) 100 MG tablet Take 100 mg by mouth at bedtime.    [provider]  triamcinolone ointment (KENALOG) 0.5 % Apply 1  application topically 2 (two) times daily. 01/19/19   Everlene Other G, DO  ipratropium (ATROVENT) 0.06 % nasal spray Place 2 sprays into both nostrils 4 (four) times daily.  12/11/19  [provider]   Physical Exam: Vitals:   03/07/23 0950 03/07/23 1000 03/07/23 1450 03/07/23 1558  BP: (!) 155/89 (!) 170/104 (!) 149/71   Pulse:  66 66   Resp:  17    Temp:    98.5 F (36.9 C)  TempSrc:    Oral  SpO2:  95% 93%   Weight:      Height:       Constitutional: appears age-appropriate, NAD, calm Eyes: PERRL, lids and conjunctivae normal ENMT: Mucous membranes are moist. Posterior pharynx clear of any exudate or lesions. Age-appropriate dentition. Hearing appropriate Neck: normal, supple, no masses, no thyromegaly Respiratory: clear to auscultation bilaterally, no wheezing, no crackles. Normal respiratory effort. No accessory muscle use.  Cardiovascular: Regular rate and rhythm, no murmurs / rubs / gallops. No extremity edema. 2+ pedal pulses. No carotid bruits.  Abdomen: Obese abdomen, + right upper quadrant and epigastric tenderness with palpation, no masses palpated, no hepatosplenomegaly. Bowel sounds positive.  Musculoskeletal: no clubbing / cyanosis. No joint deformity upper and lower extremities. Good ROM, no contractures, no atrophy. Normal muscle tone.  Skin: no rashes, lesions, ulcers. No induration Neurologic: Sensation intact. Strength 5/5 in all 4.  Psychiatric: Normal judgment and insight. Alert and oriented x 3. Normal mood.   EKG: independently reviewed, showing sinus rhythm with rate of 67, QTc 499  Chest x-ray on Admission: I personally reviewed and I agree with radiologist reading as below.  CT Angio Chest PE W and/or Wo Contrast  Result Date: 03/07/2023 CLINICAL DATA:  Right chest pain, hypoxia EXAM: CT ANGIOGRAPHY CHEST WITH CONTRAST TECHNIQUE: Multidetector CT imaging of the chest was performed using the standard protocol during bolus administration of intravenous  contrast. Multiplanar CT image reconstructions and MIPs were obtained to evaluate the vascular anatomy. RADIATION DOSE REDUCTION: This exam was performed according to the departmental dose-optimization program which includes automated exposure control, adjustment of the mA and/or kV according to patient size and/or use of iterative reconstruction technique. CONTRAST:  80mL OMNIPAQUE IOHEXOL 350 MG/ML SOLN COMPARISON:  Chest radiograph done today FINDINGS: Cardiovascular: There are no intraluminal filling defects in pulmonary artery branches. There is homogeneous enhancement in thoracic aorta. Scattered calcifications are seen in thoracic aorta. Small scattered coronary artery calcifications are seen. Heart is enlarged in size. Mediastinum/Nodes: No significant lymphadenopathy is seen. There are no inflammatory changes adjacent to the thoracic esophagus. Lungs/Pleura: There are small linear patchy infiltrates in both lower lung fields. Rest of the lung fields are clear. There is no significant pleural effusion. There is no pneumothorax. Upper Abdomen: Please refer to the report for CT abdomen and pelvis. Musculoskeletal: No acute findings are seen. Review of the MIP images confirms the above findings. IMPRESSION: There is no evidence of pulmonary embolism. There is no evidence of thoracic aortic dissection. Aortic arteriosclerosis. Small calcifications are seen in coronary artery branches. There are small linear patchy infiltrates in both lower lung fields suggesting atelectasis/pneumonia. Electronically  Signed   By: Ernie Avena M.D.   On: 03/07/2023 12:47   CT ABDOMEN PELVIS W CONTRAST  Result Date: 03/07/2023 CLINICAL DATA:  Acute right-sided abdominal pain, vomiting EXAM: CT ABDOMEN AND PELVIS WITH CONTRAST TECHNIQUE: Multidetector CT imaging of the abdomen and pelvis was performed using the standard protocol following bolus administration of intravenous contrast. RADIATION DOSE REDUCTION: This exam was  performed according to the departmental dose-optimization program which includes automated exposure control, adjustment of the mA and/or kV according to patient size and/or use of iterative reconstruction technique. CONTRAST:  80mL OMNIPAQUE IOHEXOL 350 MG/ML SOLN COMPARISON:  None Available. FINDINGS: Lower chest: Heart is enlarged in size. There are small patchy infiltrates in both lower lung fields. Left hemidiaphragm is elevated. Hepatobiliary: There is 11 mm fluid density structure in the right lobe suggesting hepatic cyst. There is no dilation of bile ducts. Gallbladder is distended. There is fluid around the gallbladder. There are high density foci in the lumen of the gallbladder suggesting gallbladder stones. Pancreas: No focal abnormalities are seen. Spleen: Unremarkable. Adrenals/Urinary Tract: Adrenals are unremarkable. There is no hydronephrosis. There are no renal or ureteral stones. There are few subcentimeter low-density foci in the kidneys suggesting possible renal cysts. Urinary bladder is not distended. Stomach/Bowel: Stomach is unremarkable. Small bowel loops are not dilated. The appendix is not distinctly visualized. In coronal image 46, there is a small caliber tubular structure in the margin of the cecum, possibly normal appendix. There is no pericecal inflammation. There is no significant wall thickening in colon. Scattered diverticula are seen without signs of focal diverticulitis. Vascular/Lymphatic: Scattered arterial calcifications are seen. Reproductive: Uterus is not seen. Other: There is no ascites or pneumoperitoneum. Umbilical/paraumbilical hernia containing fat is seen. Musculoskeletal: Degenerative changes are noted in lumbar spine, more so at L4-L5 and L5-S1 levels with significant encroachment of neural foramina. IMPRESSION: Gallbladder is distended with pericholecystic fluid. Gallbladder stones are seen. Findings suggest acute cholecystitis. Please correlate with clinical and  laboratory findings and consider gallbladder sonogram. There is no dilation of bile ducts. Small patchy infiltrates are seen in both lower lung fields suggesting subsegmental atelectasis. There is no evidence of intestinal obstruction or pneumoperitoneum. There is no hydronephrosis. Cysts seen in the liver and both kidneys. Diverticulosis of colon without signs of focal diverticulitis. Aortic arteriosclerosis. Lumbar spondylosis. Electronically Signed   By: Ernie Avena M.D.   On: 03/07/2023 12:43   DG Chest 1 View  Result Date: 03/07/2023 CLINICAL DATA:  Shortness of breath.  Chest pain EXAM: CHEST  1 VIEW COMPARISON:  Chest radiographs 06/20/2015 FINDINGS: Cardiac silhouette is at the upper limits of normal size, similar to prior. Mediastinal contours are within normal limits. Mild bilateral lower lung linear likely subsegmental atelectasis. No focal airspace opacity to indicate pneumonia. No pulmonary edema, pleural effusion, or pneumothorax. No acute skeletal abnormality. IMPRESSION: Mild bilateral lower lung linear likely subsegmental atelectasis. Electronically Signed   By: Neita Garnet M.D.   On: 03/07/2023 11:26    Labs on Admission: I have personally reviewed following labs  CBC: Recent Labs  Lab 03/07/23 1000  WBC 14.4*  NEUTROABS 12.5*  HGB 16.2*  HCT 48.7*  MCV 95.5  PLT 221   Basic Metabolic Panel: Recent Labs  Lab 03/07/23 1000  NA 140  K 4.1  CL 99  CO2 29  GLUCOSE 147*  BUN 21  CREATININE 0.75  CALCIUM 9.7   GFR: Estimated Creatinine Clearance: 87.1 mL/min (by C-G formula based on SCr of 0.75  mg/dL).  Liver Function Tests: Recent Labs  Lab 03/07/23 1000  AST 19  ALT 18  ALKPHOS 59  BILITOT 0.9  PROT 7.4  ALBUMIN 4.1   Recent Labs  Lab 03/07/23 1000  LIPASE 28   Urine analysis:    Component Value Date/Time   COLORURINE YELLOW (A) 03/07/2023 1149   APPEARANCEUR HAZY (A) 03/07/2023 1149   LABSPEC >1.046 (H) 03/07/2023 1149   PHURINE 6.0  03/07/2023 1149   GLUCOSEU NEGATIVE 03/07/2023 1149   HGBUR NEGATIVE 03/07/2023 1149   BILIRUBINUR NEGATIVE 03/07/2023 1149   KETONESUR NEGATIVE 03/07/2023 1149   PROTEINUR 30 (A) 03/07/2023 1149   NITRITE NEGATIVE 03/07/2023 1149   LEUKOCYTESUR LARGE (A) 03/07/2023 1149   This document was prepared using Dragon Voice Recognition software and may include unintentional dictation errors.  Dr. Sedalia Muta [facility]  If 7PM-7AM, please contact overnight-coverage provider If 7AM-7PM, please contact day attending provider [email or web address]  03/07/2023, 3:59 PM

## 2023-03-07 NOTE — Assessment & Plan Note (Signed)
Holding home anticholesterol medications on admission

## 2023-03-07 NOTE — Assessment & Plan Note (Signed)
Suspect secondary to acute cholecystitis, treat per above Recheck CBC in the a.m.

## 2023-03-07 NOTE — ED Notes (Signed)
EMS and fire department arrived at [phone].

## 2023-03-07 NOTE — Assessment & Plan Note (Signed)
Patient does not meet sepsis criteria however with elevated leukocytosis and source of infection, blood cultures x 2 were ordered in the ED and are in progress Continue with Zosyn per pharmacy for intra-abdominal infection Symptomatic support: Morphine 2 mg IV every 4 hours as needed for moderate pain, 2 days ordered; morphine 4 mg IV every 4 hours as needed for severe pain, 20 hours of coverage ordered EDP consulted with general surgery and interventional radiology Discussed with IR, IR specialist states that IR service will be available over the weekend however given patient's elevated troponin, agree with cardiac workup first No definitive plan for interventional radiology procedure at this time pending hospitalist cardiac workup, a.m. team to contact IR specialist on-call once cardiac workup has been completed Sodium chloride infusion at 100 mL/h, 1 day ordered Recheck CBC in the a.m.

## 2023-03-07 NOTE — ED Triage Notes (Signed)
Patient reports that she has had vertigo for the past couple days and was seen by ENT.  Patient states that she ate two bites of lasagna last night and started having vomiting and chest pain.  Patient also reports upper abdominal pain.  Patient reports that she cannot poop or pass gas.  Patient states that she tried OTC medicine with no relief.  Patient states that she did not got to the ED when chest pain started.

## 2023-03-07 NOTE — Progress Notes (Signed)
Pharmacy Antibiotic Note  Denise Huffman is a 63 y.o. female admitted on 03/07/2023 with  Intra-abdominal infection .  Pharmacy has been consulted for Zosyn dosing.  Patient presenting with complaints of right sided lower chest/upper abdominal pain and multiple episodes of vomiting. In ED, patient is afebrile with WBC 14.4. CT of abdomen shows acute cholecystitis.  Plan: Start Zosyn 3.375 g IV every 8 hours based on current renal function Monitor renal function, clinical status, culture data, and LOT F/u possible surgical intervention  Height: 5\' 9"  (175.3 cm) Weight: 90 kg (198 lb 6.6 oz) IBW/kg (Calculated) : 66.2  Temp (24hrs), Avg:98.8 F (37.1 C), Min:98.5 F (36.9 C), Max:99.1 F (37.3 C)  Recent Labs  Lab 03/07/23 1000 03/07/23 1314  WBC 14.4*  --   CREATININE 0.75  --   LATICACIDVEN  --  1.3    Estimated Creatinine Clearance: 87.1 mL/min (by C-G formula based on SCr of 0.75 mg/dL).    No Known Allergies  Antimicrobials this admission: Zosyn 8/9 >>   Dose adjustments this admission: N/A  Microbiology results: 8/9 BCx: pending  Thank you for involving pharmacy in this patient's care.   Rockwell Alexandria, PharmD Clinical Pharmacist 03/07/2023 2:19 PM

## 2023-03-08 ENCOUNTER — Inpatient Hospital Stay: Admit: 2023-03-08 | Payer: BLUE CROSS/BLUE SHIELD

## 2023-03-08 ENCOUNTER — Inpatient Hospital Stay: Payer: BLUE CROSS/BLUE SHIELD | Admitting: Radiology

## 2023-03-08 ENCOUNTER — Inpatient Hospital Stay: Payer: BLUE CROSS/BLUE SHIELD

## 2023-03-08 ENCOUNTER — Encounter: Payer: Self-pay | Admitting: Internal Medicine

## 2023-03-08 DIAGNOSIS — F32A Depression, unspecified: Secondary | ICD-10-CM | POA: Diagnosis not present

## 2023-03-08 DIAGNOSIS — K219 Gastro-esophageal reflux disease without esophagitis: Secondary | ICD-10-CM | POA: Diagnosis not present

## 2023-03-08 DIAGNOSIS — R7989 Other specified abnormal findings of blood chemistry: Secondary | ICD-10-CM | POA: Diagnosis not present

## 2023-03-08 DIAGNOSIS — R079 Chest pain, unspecified: Secondary | ICD-10-CM | POA: Diagnosis not present

## 2023-03-08 DIAGNOSIS — K810 Acute cholecystitis: Secondary | ICD-10-CM | POA: Diagnosis not present

## 2023-03-08 DIAGNOSIS — R0609 Other forms of dyspnea: Secondary | ICD-10-CM

## 2023-03-08 DIAGNOSIS — I10 Essential (primary) hypertension: Secondary | ICD-10-CM

## 2023-03-08 HISTORY — PX: IR PERC CHOLECYSTOSTOMY: IMG2326

## 2023-03-08 LAB — COMPREHENSIVE METABOLIC PANEL
ALT: 15 U/L (ref 0–44)
AST: 16 U/L (ref 15–41)
Albumin: 3.4 g/dL — ABNORMAL LOW (ref 3.5–5.0)
Alkaline Phosphatase: 48 U/L (ref 38–126)
Anion gap: 7 (ref 5–15)
BUN: 13 mg/dL (ref 8–23)
CO2: 29 mmol/L (ref 22–32)
Calcium: 8 mg/dL — ABNORMAL LOW (ref 8.9–10.3)
Chloride: 100 mmol/L (ref 98–111)
Creatinine, Ser: 1.14 mg/dL — ABNORMAL HIGH (ref 0.44–1.00)
GFR, Estimated: 54 mL/min — ABNORMAL LOW (ref >60)
Glucose, Bld: 146 mg/dL — ABNORMAL HIGH (ref 70–99)
Potassium: 3.6 mmol/L (ref 3.5–5.1)
Sodium: 136 mmol/L (ref 135–145)
Total Bilirubin: 0.9 mg/dL (ref 0.3–1.2)
Total Protein: 6.1 g/dL — ABNORMAL LOW (ref 6.5–8.1)

## 2023-03-08 LAB — CBC WITH DIFFERENTIAL/PLATELET
Abs Immature Granulocytes: 0.06 10*3/uL (ref 0.00–0.07)
Basophils Absolute: 0 10*3/uL (ref 0.0–0.1)
Basophils Relative: 0 %
Eosinophils Absolute: 0.1 10*3/uL (ref 0.0–0.5)
Eosinophils Relative: 1 %
HCT: 44.2 % (ref 36.0–46.0)
Hemoglobin: 14.2 g/dL (ref 12.0–15.0)
Immature Granulocytes: 1 %
Lymphocytes Relative: 9 %
Lymphs Abs: 1 10*3/uL (ref 0.7–4.0)
MCH: 31.3 pg (ref 26.0–34.0)
MCHC: 32.1 g/dL (ref 30.0–36.0)
MCV: 97.4 fL (ref 80.0–100.0)
Monocytes Absolute: 0.8 10*3/uL (ref 0.1–1.0)
Monocytes Relative: 7 %
Neutro Abs: 9.2 10*3/uL — ABNORMAL HIGH (ref 1.7–7.7)
Neutrophils Relative %: 82 %
Platelets: 183 10*3/uL (ref 150–400)
RBC: 4.54 MIL/uL (ref 3.87–5.11)
RDW: 13 % (ref 11.5–15.5)
WBC: 11.1 10*3/uL — ABNORMAL HIGH (ref 4.0–10.5)
nRBC: 0 % (ref 0.0–0.2)

## 2023-03-08 LAB — PROTIME-INR
INR: 1.1 (ref 0.8–1.2)
Prothrombin Time: 14.2 seconds (ref 11.4–15.2)

## 2023-03-08 LAB — LACTIC ACID, PLASMA
Lactic Acid, Venous: 1.6 mmol/L (ref 0.5–1.9)
Lactic Acid, Venous: 0.8 mmol/L (ref 0.5–1.9)

## 2023-03-08 LAB — HEPARIN LEVEL (UNFRACTIONATED): Heparin Unfractionated: 0.29 IU/mL — ABNORMAL LOW (ref 0.30–0.70)

## 2023-03-08 LAB — TROPONIN I (HIGH SENSITIVITY)
Troponin I (High Sensitivity): 93 ng/L — ABNORMAL HIGH (ref <18)
Troponin I (High Sensitivity): 76 ng/L — ABNORMAL HIGH (ref <18)

## 2023-03-08 MED ORDER — FENTANYL CITRATE (PF) 100 MCG/2ML IJ SOLN
INTRAMUSCULAR | Status: AC
  Filled 2023-03-08: qty 2

## 2023-03-08 MED ORDER — FENTANYL CITRATE (PF) 100 MCG/2ML IJ SOLN
INTRAMUSCULAR | Status: AC | PRN
  Administered 2023-03-08 (×2): 25 ug via INTRAVENOUS

## 2023-03-08 MED ORDER — SODIUM CHLORIDE 0.9% FLUSH
5.0000 mL | Freq: Three times a day (TID) | INTRAVENOUS | Status: DC
  Administered 2023-03-08 – 2023-03-11 (×10): 5 mL

## 2023-03-08 MED ORDER — MIDODRINE HCL 5 MG PO TABS: 10.0000 mg | ORAL_TABLET | Freq: Three times a day (TID) | ORAL | Status: DC

## 2023-03-08 MED ORDER — OXYCODONE-ACETAMINOPHEN 5-325 MG PO TABS
1.0000 | ORAL_TABLET | Freq: Four times a day (QID) | ORAL | Status: DC | PRN
  Administered 2023-03-08 – 2023-03-09 (×3): 1 via ORAL
  Filled 2023-03-08 (×3): qty 1

## 2023-03-08 MED ORDER — FENTANYL CITRATE (PF) 100 MCG/2ML IJ SOLN
INTRAMUSCULAR | Status: AC | PRN
Start: 2023-03-08 — End: 2023-03-08
  Administered 2023-03-08: 25 ug via INTRAVENOUS

## 2023-03-08 MED ORDER — SODIUM CHLORIDE 0.9 % IV SOLN: Freq: Once | INTRAVENOUS | Status: AC

## 2023-03-08 MED ORDER — MIDAZOLAM HCL 2 MG/2ML IJ SOLN
INTRAMUSCULAR | Status: AC | PRN
  Administered 2023-03-08: 1 mg via INTRAVENOUS

## 2023-03-08 MED ORDER — LIDOCAINE HCL 1 % IJ SOLN
INTRAMUSCULAR | Status: AC
  Filled 2023-03-08: qty 20

## 2023-03-08 MED ORDER — MIDODRINE HCL 5 MG PO TABS
10.0000 mg | ORAL_TABLET | Freq: Three times a day (TID) | ORAL | Status: DC
  Administered 2023-03-08 – 2023-03-11 (×8): 10 mg via ORAL
  Filled 2023-03-08 (×8): qty 2

## 2023-03-08 MED ORDER — MIDAZOLAM HCL 2 MG/2ML IJ SOLN
INTRAMUSCULAR | Status: AC | PRN
  Administered 2023-03-08: .5 mg via INTRAVENOUS

## 2023-03-08 MED ORDER — SODIUM CHLORIDE 0.9 % IV SOLN: INTRAVENOUS | Status: AC

## 2023-03-08 MED ORDER — IOHEXOL 300 MG/ML  SOLN
10.0000 mL | Freq: Once | INTRAMUSCULAR | Status: AC | PRN
  Administered 2023-03-08: 10 mL

## 2023-03-08 MED ORDER — LIDOCAINE HCL 1 % IJ SOLN
10.0000 mL | Freq: Once | INTRAMUSCULAR | Status: AC
  Administered 2023-03-08: 10 mL
  Filled 2023-03-08: qty 10

## 2023-03-08 MED ORDER — MIDAZOLAM HCL 2 MG/2ML IJ SOLN
INTRAMUSCULAR | Status: AC
  Filled 2023-03-08: qty 4

## 2023-03-08 NOTE — Progress Notes (Signed)
Patient ID: Denise Huffman, female   DOB: Aug 23, 1960, 62 y.o.   MRN: [id number]  SURGERY FOLLOW UP NOTE  Patient re evaluated due to concern of low blood pressure after percutaneous cholecystostomy.  Patient blood pressure had decreased to 82/55. Her heart rate has been stable at 78. Patient is complaining of abdominal pain on the right upper quadrant. No pain radiation. No alleviating factors. Aggravating factors is movement of the abdomen.   Vitals:   03/08/23 1619 03/08/23 1843  BP: (!) 82/55 (!) 89/49  Pulse: 78 77  Resp: 16 18  Temp: 98.6 F (37 C) 99.1 F (37.3 C)  SpO2: 91% 92%   General: patient is alert, no distress, good attitude Abdomen: pain in right upper quadrant close to drain.   I personally evaluated images of percutaneous cholecystostomy and no apparent complications appreciated.   A/P: Low blood pressure can be transient sepsis after drain placement on infected gallbladder. Will order new labs including STAT hemoglobin to rule out concern of bleeding as cause of hypotension. Will treat with IVFs and continue antibiotics. Will continue pain management.   This follow up encounter was for 35 minutes most of the time evaluating patient physically, evaluation of images, labs, chart and coordinating plan of care.   Carolan Shiver, MD

## 2023-03-08 NOTE — Progress Notes (Signed)
  Echocardiogram 2D Echocardiogram has been performed.  Denise Huffman 03/08/2023, 8:59 AM

## 2023-03-08 NOTE — Progress Notes (Addendum)
Patient ID: Denise Huffman, female   DOB: 1960-08-29, 62 y.o.   MRN: [id number]     SURGICAL PROGRESS NOTE   Hospital Day(s): 1.   Interval History: Patient seen and examined, no acute events or new complaints overnight. Patient reports feeling better this morning.  She denies any new complaint.  She is still tender on palpation in the right upper quadrant.  Denies any chest pain or shortness of breath.  Vital signs in last 24 hours: [min-max] current  Temp:  [97.7 F (36.5 C)-99.3 F (37.4 C)] 99.3 F (37.4 C) (08/10 0431) Pulse Rate:  [66-88] 68 (08/10 0431) Resp:  [9-22] 16 (08/10 0431) BP: (149-170)/(71-115) 157/80 (08/10 0431) SpO2:  [88 %-97 %] 92 % (08/10 0431) Weight:  [90 kg-90.7 kg] 90 kg (08/09 0948)     Height: 5\' 9"  (175.3 cm) Weight: 90 kg BMI (Calculated): 29.29   Physical Exam:  Constitutional: alert, cooperative and no distress  Respiratory: breathing non-labored at rest  Cardiovascular: regular rate and sinus rhythm  Gastrointestinal: soft, tender on palpation in the right upper quadrant, and non-distended  Labs:     Latest Ref Rng & Units 03/08/2023    4:31 AM 03/07/2023   10:00 AM 02/10/2023    9:05 AM  CBC  WBC 4.0 - 10.5 K/uL 14.2  14.4  8.5   Hemoglobin 12.0 - 15.0 g/dL 27.2  53.6  64.4   Hematocrit 36.0 - 46.0 % 45.4  48.7  45.4   Platelets 150 - 400 K/uL 214  221  184       Latest Ref Rng & Units 03/08/2023    4:31 AM 03/07/2023   10:00 AM 02/10/2023    9:05 AM  CMP  Glucose 70 - 99 mg/dL 034  742  595   BUN 8 - 23 mg/dL 10  21  19    Creatinine 0.44 - 1.00 mg/dL 6.38  7.56  4.33   Sodium 135 - 145 mmol/L 135  140  136   Potassium 3.5 - 5.1 mmol/L 3.5  4.1  3.6   Chloride 98 - 111 mmol/L 100  99  104   CO2 22 - 32 mmol/L 26  29  23    Calcium 8.9 - 10.3 mg/dL 8.5  9.7  8.4   Total Protein 6.5 - 8.1 g/dL  7.4  6.8   Total Bilirubin 0.3 - 1.2 mg/dL  0.9  0.8   Alkaline Phos 38 - 126 U/L  59  58   AST 15 - 41 U/L  19  21   ALT 0 - 44 U/L  18  15      Imaging studies: No new pertinent imaging studies   Assessment/Plan:  62 y.o. female with acute cholecystitis, complicated by pertinent comorbidities including elevated troponins and hypoxemia on admission.  -Continue treatment of severe cholecystitis with IV antibiotic therapy.  If cleared by cardiology will recommend percutaneous cholecystectomy due to the severity of the cholecystitis. -Patient was oriented again the rationale of percutaneous cholecystostomy which is less risky at this point due to the severity of the cholecystitis -May restart full liquid diet after cholecystostomy is placed. -Will continue to follow along.  Most likely after cholecystostomy if patient tolerates well will be able to be discharged within 24 to 48 hours if no other medical or cardiac complications.  This initial encounter was for 55 minutes most of the time evaluating the patient, evaluation of chart and coordinating plan of care with Primary Team and consultants.  Gae Gallop, MD

## 2023-03-08 NOTE — Progress Notes (Signed)
Pt transfer to 2C. Room 206. Report given to [facility].

## 2023-03-08 NOTE — Progress Notes (Signed)
ANTICOAGULATION CONSULT NOTE   Pharmacy Consult for Heparin Infusion Indication: chest pain/ACS  No Known Allergies  Patient Measurements: Height: 5\' 9"  (175.3 cm) Weight: 90 kg (198 lb 6.6 oz) IBW/kg (Calculated) : 66.2 Heparin Dosing Weight: 84.9 kg  Vital Signs: Temp: 99.3 F (37.4 C) (08/10 0431) Temp Source: Oral (08/10 0431) BP: 157/80 (08/10 0431) Pulse Rate: 68 (08/10 0431)  Labs: Recent Labs    03/07/23 1000 03/07/23 1203 03/07/23 1434 03/07/23 1656 03/07/23 1915 03/07/23 2104 03/08/23 0431  HGB 16.2*  --   --   --   --   --  15.7*  HCT 48.7*  --   --   --   --   --  45.4  PLT 221  --   --   --   --   --  214  LABPROT  --   --  13.9  --   --   --   --   INR  --   --  1.1  --   --   --   --   HEPARINUNFRC  --   --   --   --   --  <0.10* 0.37  CREATININE 0.75  --   --   --   --   --   --   TROPONINIHS 229* 250*  --  404* 370*  --   --     Estimated Creatinine Clearance: 87.1 mL/min (by C-G formula based on SCr of 0.75 mg/dL).   Medical History: Past Medical History:  Diagnosis Date   Anxiety    Asthma    Depression    GERD (gastroesophageal reflux disease)    Hyperlipidemia    Hypertension    Insomnia    Vertigo    Assessment: Denise Huffman is a 62 y.o. female presenting with chest pain and SOB. PMH significant for HTN, HLD, GERD, depression. Patient was not on [facility] PTA per chart review. Pharmacy has been consulted to initiate and manage heparin infusion.   Baseline Labs: PT 13.9, INR 1.1, Hgb 16.2, Hct 48.7, Plt 221   Goal of Therapy:  Heparin level 0.3-0.7 units/ml Monitor platelets by anticoagulation protocol: Yes   Date Time HL Rate/Comment  8/9 2104 <0.10 1050/subtherapeutic 8/10 0431 0.37 1350/therapeutic x 1  Plan:  Continue heparin infusion at 1350 units/hr Recheck HL in 6 hours to confirm Continue to monitor H&H and platelets daily while on heparin infusion   Thank you for involving pharmacy in this patient's care.   Otelia Sergeant, PharmD, [facility] 03/08/2023 5:49 AM

## 2023-03-08 NOTE — Procedures (Signed)
Interventional Radiology Procedure Note  Date of Procedure: 03/08/2023  Procedure: Cholecystostomy tube placement   Findings:  1. Successful cholecystostomy tube placement 10 Fr    Complications: No immediate complications noted.   Estimated Blood Loss: minimal  Follow-up and Recommendations: 1. Instructions per EMR  2. Follow up with surgery    Olive Bass, MD  Vascular & Interventional Radiology  03/08/2023 1:29 PM

## 2023-03-08 NOTE — Progress Notes (Signed)
PROGRESS NOTE    Denise Huffman  EPP:[id number] DOB: January 14, 1961 DOA: 03/07/2023 PCP: [facility] (Confirm with patient/family/NH records and if not entered, this HAS to be entered at [facility] point of entry. "No PCP" if truly none.)   Brief Narrative: (Start on day 1 of progress note - keep it brief and live)  62 year old female with history of GERD, hyperlipidemia, depression, hypertension admitted for acute cholecystitis and elevated troponin admitted for acute cholecystitis   Assessment & Plan:   Principal Problem:   Acute cholecystitis Active Problems:   Hypertension   Elevated troponin   Hyperlipidemia   Leukocytosis   GERD (gastroesophageal reflux disease)   Depression   Pyuria  * Acute cholecystitis Patient does not meet sepsis criteria however with elevated leukocytosis and source of infection, blood cultures x 2 which are pending Continue with Zosyn per pharmacy for intra-abdominal infection Undergoing percutaneous cholecystostomy under IR today, surgery following   Pyuria Present on admission, UA was positive for large leukocytes on admission Continue Zosyn which should cover possible urine infection   Depression Home bupropion 150 mg every morning, trazodone 100 mg nightly, Abilify 10 mg daily were resumed on admission   GERD (gastroesophageal reflux disease) Protonix 40 mg IV twice daily   Leukocytosis Suspect secondary to acute cholecystitis, treat per above   Hyperlipidemia Holding home anticholesterol medications on admission   Elevated troponin Due to demand ischemia.  No MI Patient is cleared for planned procedure by cardiology.   Echo within normal limits   Hypertension Continue amlodipine and lisinopril, as needed hydralazine   DVT prophylaxis: SCDs Place TED hose Start: 03/07/23 1414     Code Status: Full code Family Communication: (NO "discussed with patient") Disposition Plan: Possible discharge in next 2 to 3 days depending on clinical  condition and postop recovery   Consultants:  Surgery Cardiology IR  Procedures:  Percutaneous cholecystostomy  Antimicrobials:  Zosyn   Subjective: Complaining of right upper quadrant pain  Objective: Vitals:   03/08/23 0901 03/08/23 1127 03/08/23 1255 03/08/23 1305  BP: 118/68 112/61 (!) 140/85 (!) 143/83  Pulse: 63 65 86 71  Resp: 18 16 17 17   Temp: 97.9 F (36.6 C) 98.7 F (37.1 C)    TempSrc:      SpO2: 92% 93% 92% (!) 88%  Weight:      Height:        Intake/Output Summary (Last 24 hours) at 03/08/2023 1311 Last data filed at 03/08/2023 0319 Gross per 24 hour  Intake 2885.22 ml  Output 20 ml  Net 2865.22 ml   Filed Weights   03/07/23 0948  Weight: 90 kg    Examination:  General exam: Appears calm and comfortable  Respiratory system: Clear to auscultation. Respiratory effort normal. Cardiovascular system: S1 & S2 heard, RRR. No JVD, murmurs, rubs, gallops or clicks. No pedal edema. Gastrointestinal system: Abdomen is nondistended, soft and nontender. No organomegaly or masses felt. Normal bowel sounds heard. Central nervous system: Alert and oriented. No focal neurological deficits. Extremities: Symmetric 5 x 5 power. Skin: No rashes, lesions or ulcers Psychiatry: Judgement and insight appear normal. Mood & affect appropriate.     Data Reviewed: I have personally reviewed following labs and imaging studies  CBC: Recent Labs  Lab 03/07/23 1000 03/08/23 0431  WBC 14.4* 14.2*  NEUTROABS 12.5*  --   HGB 16.2* 15.7*  HCT 48.7* 45.4  MCV 95.5 93.2  PLT 221 214   Basic Metabolic Panel: Recent Labs  Lab 03/07/23  1000 03/08/23 0431  NA 140 135  K 4.1 3.5  CL 99 100  CO2 29 26  GLUCOSE 147* 140*  BUN 21 10  CREATININE 0.75 0.62  CALCIUM 9.7 8.5*   GFR: Estimated Creatinine Clearance: 87.1 mL/min (by C-G formula based on SCr of 0.62 mg/dL). Liver Function Tests: Recent Labs  Lab 03/07/23 1000  AST 19  ALT 18  ALKPHOS 59  BILITOT  0.9  PROT 7.4  ALBUMIN 4.1   Recent Labs  Lab 03/07/23 1000  LIPASE 28   No results for input(s): "AMMONIA" in the last 168 hours. Coagulation Profile: Recent Labs  Lab 03/07/23 1434  INR 1.1    Sepsis Labs: Recent Labs  Lab 03/07/23 1314 03/07/23 1557  LATICACIDVEN 1.3 1.1    Recent Results (from the past 240 hour(s))  Resp panel by RT-PCR (RSV, Flu A&B, Covid) Anterior Nasal Swab     Status: None   Collection Time: 03/07/23  1:14 PM   Specimen: Anterior Nasal Swab  Result Value Ref Range Status   SARS Coronavirus 2 by RT PCR NEGATIVE NEGATIVE Final    Comment: (NOTE) SARS-CoV-2 target nucleic acids are NOT DETECTED.  The SARS-CoV-2 RNA is generally detectable in upper respiratory specimens during the acute phase of infection. The lowest concentration of SARS-CoV-2 viral copies this assay can detect is 138 copies/mL. A negative result does not preclude SARS-Cov-2 infection and should not be used as the sole basis for treatment or other patient management decisions. A negative result may occur with  improper specimen collection/handling, submission of specimen other than nasopharyngeal swab, presence of viral mutation(s) within the areas targeted by this assay, and inadequate number of viral copies(<138 copies/mL). A negative result must be combined with clinical observations, patient history, and epidemiological information. The expected result is Negative.  Fact Sheet for Patients:  BloggerCourse.com  Fact Sheet for Healthcare Providers:  SeriousBroker.it  This test is no t yet approved or cleared by the [place] FDA and  has been authorized for detection and/or diagnosis of SARS-CoV-2 by FDA under an Emergency Use Authorization (EUA). This EUA will remain  in effect (meaning this test can be used) for the duration of the COVID-19 declaration under Section 564(b)(1) of the Act, 21 U.S.C.section  360bbb-3(b)(1), unless the authorization is terminated  or revoked sooner.       Influenza A by PCR NEGATIVE NEGATIVE Final   Influenza B by PCR NEGATIVE NEGATIVE Final    Comment: (NOTE) The Xpert Xpress SARS-CoV-2/FLU/RSV plus assay is intended as an aid in the diagnosis of influenza from Nasopharyngeal swab specimens and should not be used as a sole basis for treatment. Nasal washings and aspirates are unacceptable for Xpert Xpress SARS-CoV-2/FLU/RSV testing.  Fact Sheet for Patients: BloggerCourse.com  Fact Sheet for Healthcare Providers: SeriousBroker.it  This test is not yet approved or cleared by the [place] FDA and has been authorized for detection and/or diagnosis of SARS-CoV-2 by FDA under an Emergency Use Authorization (EUA). This EUA will remain in effect (meaning this test can be used) for the duration of the COVID-19 declaration under Section 564(b)(1) of the Act, 21 U.S.C. section 360bbb-3(b)(1), unless the authorization is terminated or revoked.     Resp Syncytial Virus by PCR NEGATIVE NEGATIVE Final    Comment: (NOTE) Fact Sheet for Patients: BloggerCourse.com  Fact Sheet for Healthcare Providers: SeriousBroker.it  This test is not yet approved or cleared by the [place] FDA and has been authorized for detection and/or diagnosis  of SARS-CoV-2 by FDA under an Emergency Use Authorization (EUA). This EUA will remain in effect (meaning this test can be used) for the duration of the COVID-19 declaration under Section 564(b)(1) of the Act, 21 U.S.C. section 360bbb-3(b)(1), unless the authorization is terminated or revoked.  Performed at [facility], [address] [place], [place] [address]   Blood Culture (routine x 2)     Status: None (Preliminary result)   Collection Time: 03/07/23  1:19 PM   Specimen: BLOOD  Result Value Ref Range  Status   Specimen Description BLOOD LEFT ANTECUBITAL  Final   Special Requests   Final    BOTTLES DRAWN AEROBIC AND ANAEROBIC Blood Culture adequate volume   Culture   Final    NO GROWTH < 24 HOURS Performed at [facility], [address], [place], [place] [address]    Report Status PENDING  Incomplete  Blood Culture (routine x 2)     Status: None (Preliminary result)   Collection Time: 03/07/23  1:46 PM   Specimen: BLOOD  Result Value Ref Range Status   Specimen Description BLOOD RIGHT ANTECUBITAL  Final   Special Requests   Final    BOTTLES DRAWN AEROBIC AND ANAEROBIC Blood Culture adequate volume   Culture   Final    NO GROWTH < 24 HOURS Performed at [facility], [address], [place], [place] [address]    Report Status PENDING  Incomplete         Radiology Studies: ECHOCARDIOGRAM COMPLETE  Result Date: 03/08/2023    ECHOCARDIOGRAM REPORT   Patient Name:   MYRTA LENIUS Derrington Date of Exam: 03/08/2023 Medical Rec #:  [id number]  Height:       69.0 in Accession #:    [id number] Weight:       198.4 lb Date of Birth:  [birth year]  BSA:          2.059 m Patient Age:    62 years   BP:           157/80 mmHg Patient Gender: F          HR:           63 bpm. Exam Location:  [facility] Procedure: 2D Echo and Strain Analysis Indications:     Dyspnea R06.00                  Chest Pain R07.9  History:         Patient has no prior history of Echocardiogram examinations.  Sonographer:     Overton Mam RDCS, FASE Referring Phys:  [id number] AMY N Minardi Diagnosing Phys: Jodelle Red MD  Sonographer Comments: Global longitudinal strain was attempted. IMPRESSIONS  1. Left ventricular ejection fraction, by estimation, is 55 to 60%. The left ventricle has normal function. The left ventricle has no regional wall motion abnormalities. There is moderate concentric left ventricular hypertrophy. Left ventricular diastolic parameters were normal.  2. Right ventricular systolic function is normal.  The right ventricular size is normal. There is normal pulmonary artery systolic pressure.  3. The mitral valve is normal in structure. Trivial mitral valve regurgitation. No evidence of mitral stenosis.  4. The aortic valve is tricuspid. Aortic valve regurgitation is not visualized. No aortic stenosis is present.  5. The inferior vena cava is normal in size with greater than 50% respiratory variability, suggesting right atrial pressure of 3 mmHg. Comparison(s): No prior Echocardiogram. Conclusion(s)/Recommendation(s): Normal biventricular function without evidence of hemodynamically significant valvular heart  disease. FINDINGS  Left Ventricle: Left ventricular ejection fraction, by estimation, is 55 to 60%. The left ventricle has normal function. The left ventricle has no regional wall motion abnormalities. The global longitudinal strain is normal despite suboptimal segment tracking. The left ventricular internal cavity size was normal in size. There is moderate concentric left ventricular hypertrophy. Left ventricular diastolic parameters were normal. Right Ventricle: The right ventricular size is normal. Right vetricular wall thickness was not well visualized. Right ventricular systolic function is normal. There is normal pulmonary artery systolic pressure. The tricuspid regurgitant velocity is 2.42 m/s, and with an assumed right atrial pressure of 3 mmHg, the estimated right ventricular systolic pressure is 26.4 mmHg. Left Atrium: Left atrial size was normal in size. Right Atrium: Right atrial size was normal in size. Pericardium: There is no evidence of pericardial effusion. Mitral Valve: The mitral valve is normal in structure. Trivial mitral valve regurgitation. No evidence of mitral valve stenosis. Tricuspid Valve: The tricuspid valve is normal in structure. Tricuspid valve regurgitation is trivial. No evidence of tricuspid stenosis. Aortic Valve: The aortic valve is tricuspid. Aortic valve regurgitation is  not visualized. No aortic stenosis is present. Aortic valve peak gradient measures 11.3 mmHg. Pulmonic Valve: The pulmonic valve was not well visualized. Pulmonic valve regurgitation is not visualized. No evidence of pulmonic stenosis. Aorta: The aortic root, ascending aorta and aortic arch are all structurally normal, with no evidence of dilitation or obstruction. Venous: The inferior vena cava is normal in size with greater than 50% respiratory variability, suggesting right atrial pressure of 3 mmHg. IAS/Shunts: The atrial septum is grossly normal.  LEFT VENTRICLE PLAX 2D LVIDd:         4.70 cm   Diastology LVIDs:         3.30 cm   LV e' medial:    6.20 cm/s LV PW:         1.30 cm   LV E/e' medial:  8.4 LV IVS:        1.40 cm   LV e' lateral:   7.18 cm/s LVOT diam:     2.00 cm   LV E/e' lateral: 7.2 LV SV:         67 LV SV Index:   32 LVOT Area:     3.14 cm  RIGHT VENTRICLE RV Basal diam:  3.30 cm RV S prime:     12.40 cm/s TAPSE (M-mode): 1.6 cm LEFT ATRIUM             Index        RIGHT ATRIUM           Index LA diam:        4.20 cm 2.04 cm/m   RA Area:     15.20 cm LA Vol (A2C):   42.0 ml 20.40 ml/m  RA Volume:   38.80 ml  18.84 ml/m LA Vol (A4C):   34.9 ml 16.95 ml/m LA Biplane Vol: 38.4 ml 18.65 ml/m  AORTIC VALVE                 PULMONIC VALVE AV Area (Vmax): 2.24 cm     PV Vmax:        1.04 m/s AV Vmax:        168.00 cm/s  PV Peak grad:   4.3 mmHg AV Peak Grad:   11.3 mmHg    RVOT Peak grad: 3 mmHg LVOT Vmax:      120.00 cm/s LVOT Vmean:  76.600 cm/s LVOT VTI:       0.213 m  AORTA Ao Root diam: 3.20 cm Ao Asc diam:  3.20 cm MITRAL VALVE               TRICUSPID VALVE MV Area (PHT): 2.58 cm    TR Peak grad:   23.4 mmHg MV Decel Time: 294 msec    TR Vmax:        242.00 cm/s MV E velocity: 51.90 cm/s MV A velocity: 72.80 cm/s  SHUNTS MV E/A ratio:  0.71        Systemic VTI:  0.21 m                            Systemic Diam: 2.00 cm Jodelle Red MD Electronically signed by Jodelle Red MD Signature Date/Time: 03/08/2023/10:31:33 AM    Final    US Abdomen Limited RUQ (LIVER/GB)  Result Date: 03/08/2023 CLINICAL DATA:  Acute cholecystitis. EXAM: ULTRASOUND ABDOMEN LIMITED RIGHT UPPER QUADRANT COMPARISON:  CT 03/07/2023 FINDINGS: Gallbladder: Positive sonographic Murphy's sign. Gallbladder wall thickening. Multiple gallstones with dense posterior shadowing. A portion of the gallbladder wall appears sloughed into the lumen similar to CT. Common bile duct: Diameter: Normal at 4 mm Liver: Increased liver echogenicity no biliary duct dilatation portal vein is patent on color Doppler imaging with normal direction of blood flow towards the liver. Other: None. IMPRESSION: Ultrasound findings consistent acute cholecystitis. Electronically Signed   By: Genevive Bi M.D.   On: 03/08/2023 09:38   CT Angio Chest PE W and/or Wo Contrast  Result Date: 03/07/2023 CLINICAL DATA:  Right chest pain, hypoxia EXAM: CT ANGIOGRAPHY CHEST WITH CONTRAST TECHNIQUE: Multidetector CT imaging of the chest was performed using the standard protocol during bolus administration of intravenous contrast. Multiplanar CT image reconstructions and MIPs were obtained to evaluate the vascular anatomy. RADIATION DOSE REDUCTION: This exam was performed according to the departmental dose-optimization program which includes automated exposure control, adjustment of the mA and/or kV according to patient size and/or use of iterative reconstruction technique. CONTRAST:  80mL OMNIPAQUE IOHEXOL 350 MG/ML SOLN COMPARISON:  Chest radiograph done today FINDINGS: Cardiovascular: There are no intraluminal filling defects in pulmonary artery branches. There is homogeneous enhancement in thoracic aorta. Scattered calcifications are seen in thoracic aorta. Small scattered coronary artery calcifications are seen. Heart is enlarged in size. Mediastinum/Nodes: No significant lymphadenopathy is seen. There are no inflammatory changes  adjacent to the thoracic esophagus. Lungs/Pleura: There are small linear patchy infiltrates in both lower lung fields. Rest of the lung fields are clear. There is no significant pleural effusion. There is no pneumothorax. Upper Abdomen: Please refer to the report for CT abdomen and pelvis. Musculoskeletal: No acute findings are seen. Review of the MIP images confirms the above findings. IMPRESSION: There is no evidence of pulmonary embolism. There is no evidence of thoracic aortic dissection. Aortic arteriosclerosis. Small calcifications are seen in coronary artery branches. There are small linear patchy infiltrates in both lower lung fields suggesting atelectasis/pneumonia. Electronically Signed   By: Ernie Avena M.D.   On: 03/07/2023 12:47   CT ABDOMEN PELVIS W CONTRAST  Result Date: 03/07/2023 CLINICAL DATA:  Acute right-sided abdominal pain, vomiting EXAM: CT ABDOMEN AND PELVIS WITH CONTRAST TECHNIQUE: Multidetector CT imaging of the abdomen and pelvis was performed using the standard protocol following bolus administration of intravenous contrast. RADIATION DOSE REDUCTION: This exam was performed according to the departmental dose-optimization program which includes  automated exposure control, adjustment of the mA and/or kV according to patient size and/or use of iterative reconstruction technique. CONTRAST:  80mL OMNIPAQUE IOHEXOL 350 MG/ML SOLN COMPARISON:  None Available. FINDINGS: Lower chest: Heart is enlarged in size. There are small patchy infiltrates in both lower lung fields. Left hemidiaphragm is elevated. Hepatobiliary: There is 11 mm fluid density structure in the right lobe suggesting hepatic cyst. There is no dilation of bile ducts. Gallbladder is distended. There is fluid around the gallbladder. There are high density foci in the lumen of the gallbladder suggesting gallbladder stones. Pancreas: No focal abnormalities are seen. Spleen: Unremarkable. Adrenals/Urinary Tract: Adrenals are  unremarkable. There is no hydronephrosis. There are no renal or ureteral stones. There are few subcentimeter low-density foci in the kidneys suggesting possible renal cysts. Urinary bladder is not distended. Stomach/Bowel: Stomach is unremarkable. Small bowel loops are not dilated. The appendix is not distinctly visualized. In coronal image 46, there is a small caliber tubular structure in the margin of the cecum, possibly normal appendix. There is no pericecal inflammation. There is no significant wall thickening in colon. Scattered diverticula are seen without signs of focal diverticulitis. Vascular/Lymphatic: Scattered arterial calcifications are seen. Reproductive: Uterus is not seen. Other: There is no ascites or pneumoperitoneum. Umbilical/paraumbilical hernia containing fat is seen. Musculoskeletal: Degenerative changes are noted in lumbar spine, more so at L4-L5 and L5-S1 levels with significant encroachment of neural foramina. IMPRESSION: Gallbladder is distended with pericholecystic fluid. Gallbladder stones are seen. Findings suggest acute cholecystitis. Please correlate with clinical and laboratory findings and consider gallbladder sonogram. There is no dilation of bile ducts. Small patchy infiltrates are seen in both lower lung fields suggesting subsegmental atelectasis. There is no evidence of intestinal obstruction or pneumoperitoneum. There is no hydronephrosis. Cysts seen in the liver and both kidneys. Diverticulosis of colon without signs of focal diverticulitis. Aortic arteriosclerosis. Lumbar spondylosis. Electronically Signed   By: Ernie Avena M.D.   On: 03/07/2023 12:43   DG Chest 1 View  Result Date: 03/07/2023 CLINICAL DATA:  Shortness of breath.  Chest pain EXAM: CHEST  1 VIEW COMPARISON:  Chest radiographs 06/20/2015 FINDINGS: Cardiac silhouette is at the upper limits of normal size, similar to prior. Mediastinal contours are within normal limits. Mild bilateral lower lung linear  likely subsegmental atelectasis. No focal airspace opacity to indicate pneumonia. No pulmonary edema, pleural effusion, or pneumothorax. No acute skeletal abnormality. IMPRESSION: Mild bilateral lower lung linear likely subsegmental atelectasis. Electronically Signed   By: Neita Garnet M.D.   On: 03/07/2023 11:26        Scheduled Meds:  amLODipine  10 mg Oral Daily   ARIPiprazole  10 mg Oral Daily   buPROPion  150 mg Oral q morning   lisinopril  20 mg Oral Daily   pantoprazole (PROTONIX) IV  40 mg Intravenous BID   traZODone  100 mg Oral QHS   Continuous Infusions:  sodium chloride 100 mL/hr at 03/08/23 0448   piperacillin-tazobactam (ZOSYN)  IV 3.375 g (03/08/23 0504)     LOS: 1 day    Time spent: 35 minutes    Claretta Kendra Sherryll Burger, MD [facility] Pager [phone]  If 7PM-7AM, please contact night-coverage [email or web address] 03/08/2023, 1:11 PM

## 2023-03-08 NOTE — Consult Note (Signed)
Cardiology Consult    Patient ID: ONESHA BALLER MRN: [id number], DOB/AGE: Jun 23, 1961   Admit date: 03/07/2023 Date of Consult: 03/08/2023  Primary Physician: [facility] Primary Primary Cardiologist: None Requesting Provider: V. Sherryll Burger, MD  Patient Profile    Denise Huffman is a 61 y.o. female with a history of GERD, HTN, HL, vertigo, and depression, who is being seen today for the evaluation of troponin elevation in the setting of hypertensive urgency and acute cholecystitis at the request of Dr. Sherryll Burger.  Past Medical History   Past Medical History:  Diagnosis Date   Anxiety    Asthma    Depression    GERD (gastroesophageal reflux disease)    Hyperlipidemia    Hypertension    Insomnia    Vertigo     Past Surgical History:  Procedure Laterality Date   ABDOMINAL HYSTERECTOMY     BACK SURGERY       Allergies  No Known Allergies  History of Present Illness    62 y.o. female with a history of GERD, HTN, HL, vertigo, and depression.  She has no prior cardiac hx.  She lives locally and is fairly active, though does not routinely exercise.  No h/o chest pain, though has chronic DOE, which she attributes to being overweight and inactive.  She has a long h/o intermittent vertigo, which flared up earlier this week.  She manages this w/ vertigo, w/ varied success.  Beginning on the morning of 8/8, she began to experience RUQ pain assoc w/ nausea and vomiting.  This persisted throughout the day and into 8/9, prompting her to present to the ED on the morning of 8/9.  Upon arrival to the ED, she had a low grade fever @ 99.1, BP elevated @ 170/104, O2 sat 95% on 3lpm.  Labs notable for leukocytosis (14.4) and elevated hsTrop @ 229  250  404  370.  ECG w/o acute ST/T changes.  CXR w/ atx, CTA chest neg for PE/dissection. Cor Ca2+ noted.  Small linear patchy infiltrates in lower lung fields suggestive of atx vs pna, noted.  CT abs/pelvis showed distended GB w/ pericholecystic fluid and stones suggestive of  acute cholecystitis.  GB u/s and echo pending this AM.  She has ongoing RUQ discomfort but is in no distress at this time.  Inpatient Medications     amLODipine  10 mg Oral Daily   ARIPiprazole  10 mg Oral Daily   buPROPion  150 mg Oral q morning   lisinopril  20 mg Oral Daily   pantoprazole (PROTONIX) IV  40 mg Intravenous BID   traZODone  100 mg Oral QHS    Family History    Family History  Problem Relation Age of Onset   Urinary tract infection Mother    She indicated that her mother is deceased. She indicated that her father is alive.   Social History    Social History   Socioeconomic History   Marital status: Single    Spouse name: Not on file   Number of children: Not on file   Years of education: Not on file   Highest education level: Not on file  Occupational History   Not on file  Tobacco Use   Smoking status: Never   Smokeless tobacco: Never  Vaping Use   Vaping status: Never Used  Substance and Sexual Activity   Alcohol use: No   Drug use: No   Sexual activity: Not Currently  Other Topics Concern   Not  on file  Social History Narrative   Lives w/ family in [place].  Works part-time.  Artist - paints wall murals.   Social Determinants of Health   Financial Resource Strain: Low Risk  (11/26/2022)   [facility] from [facility]   Overall Financial Resource Strain (CARDIA)    Difficulty of Paying Living Expenses: Not hard at all  Food Insecurity: No Food Insecurity (03/07/2023)   Hunger Vital Sign    Worried About Running Out of Food in the Last Year: Never true    Ran Out of Food in the Last Year: Never true  Transportation Needs: No Transportation Needs (03/07/2023)   PRAPARE - Administrator, Civil Service (Medical): No    Lack of Transportation (Non-Medical): No  Physical Activity: Insufficiently Active (02/09/2021)   [facility]   Exercise Vital Sign    Days of Exercise per Week: 2 days    Minutes of Exercise per  Session: 20 min  Stress: Not on file  Social Connections: Not on file  Intimate Partner Violence: Not At Risk (03/07/2023)   Humiliation, Afraid, Rape, and Kick questionnaire    Fear of Current or Ex-Partner: No    Emotionally Abused: No    Physically Abused: No    Sexually Abused: No     Review of Systems    General:  No chills, fever, night sweats or weight changes.  Cardiovascular:  No chest pain, +++ chronic dyspnea on exertion, edema, orthopnea, palpitations, paroxysmal nocturnal dyspnea. Dermatological: No rash, lesions/masses Respiratory: No cough, +++ dyspnea Urologic: No hematuria, dysuria Abdominal:   +++ nausea, +++ vomiting, +++ RUQ pain. No diarrhea, bright red blood per rectum, melena, or hematemesis Neurologic:  No visual changes, wkns, changes in mental status. All other systems reviewed and are otherwise negative except as noted above.  Physical Exam    Blood pressure 118/68, pulse 63, temperature 97.9 F (36.6 C), resp. rate 18, height 5\' 9"  (1.753 m), weight 90 kg, SpO2 92%.  General: Pleasant, NAD Psych: Normal affect. Neuro: Alert and oriented X 3. Moves all extremities spontaneously. HEENT: Normal  Neck: Supple without bruits or JVD. Lungs:  Resp regular and unlabored, bibasilar crackles. Heart: RRR no s3, s4, or murmurs. Abdomen: Soft, RUQ tenderness to light palpation, non-distended, BS + x 4.  Extremities: No clubbing, cyanosis or edema. DP/PT2+, Radials 2+ and equal bilaterally.  Labs    Cardiac Enzymes Recent Labs  Lab 03/07/23 1000 03/07/23 1203 03/07/23 1656 03/07/23 1915  TROPONINIHS 229* 250* 404* 370*     Lab Results  Component Value Date   WBC 14.2 (H) 03/08/2023   HGB 15.7 (H) 03/08/2023   HCT 45.4 03/08/2023   MCV 93.2 03/08/2023   PLT 214 03/08/2023    Recent Labs  Lab 03/07/23 1000 03/08/23 0431  NA 140 135  K 4.1 3.5  CL 99 100  CO2 29 26  BUN 21 10  CREATININE 0.75 0.62  CALCIUM 9.7 8.5*  PROT 7.4  --   BILITOT  0.9  --   ALKPHOS 59  --   ALT 18  --   AST 19  --   GLUCOSE 147* 140*    Radiology Studies    US Abdomen Limited RUQ (LIVER/GB)  Result Date: 03/08/2023 CLINICAL DATA:  Acute cholecystitis. EXAM: ULTRASOUND ABDOMEN LIMITED RIGHT UPPER QUADRANT COMPARISON:  CT 03/07/2023 FINDINGS: Gallbladder: Positive sonographic Murphy's sign. Gallbladder wall thickening. Multiple gallstones with dense posterior shadowing. A portion of the gallbladder  wall appears sloughed into the lumen similar to CT. Common bile duct: Diameter: Normal at 4 mm Liver: Increased liver echogenicity no biliary duct dilatation portal vein is patent on color Doppler imaging with normal direction of blood flow towards the liver. Other: None. IMPRESSION: Ultrasound findings consistent acute cholecystitis. Electronically Signed   By: Genevive Bi M.D.   On: 03/08/2023 09:38   CT Angio Chest PE W and/or Wo Contrast  Result Date: 03/07/2023 CLINICAL DATA:  Right chest pain, hypoxia EXAM: CT ANGIOGRAPHY CHEST WITH CONTRAST TECHNIQUE: Multidetector CT imaging of the chest was performed using the standard protocol during bolus administration of intravenous contrast. Multiplanar CT image reconstructions and MIPs were obtained to evaluate the vascular anatomy. RADIATION DOSE REDUCTION: This exam was performed according to the departmental dose-optimization program which includes automated exposure control, adjustment of the mA and/or kV according to patient size and/or use of iterative reconstruction technique. CONTRAST:  80mL OMNIPAQUE IOHEXOL 350 MG/ML SOLN COMPARISON:  Chest radiograph done today FINDINGS: Cardiovascular: There are no intraluminal filling defects in pulmonary artery branches. There is homogeneous enhancement in thoracic aorta. Scattered calcifications are seen in thoracic aorta. Small scattered coronary artery calcifications are seen. Heart is enlarged in size. Mediastinum/Nodes: No significant lymphadenopathy is seen. There  are no inflammatory changes adjacent to the thoracic esophagus. Lungs/Pleura: There are small linear patchy infiltrates in both lower lung fields. Rest of the lung fields are clear. There is no significant pleural effusion. There is no pneumothorax. Upper Abdomen: Please refer to the report for CT abdomen and pelvis. Musculoskeletal: No acute findings are seen. Review of the MIP images confirms the above findings. IMPRESSION: There is no evidence of pulmonary embolism. There is no evidence of thoracic aortic dissection. Aortic arteriosclerosis. Small calcifications are seen in coronary artery branches. There are small linear patchy infiltrates in both lower lung fields suggesting atelectasis/pneumonia. Electronically Signed   By: Ernie Avena M.D.   On: 03/07/2023 12:47   CT ABDOMEN PELVIS W CONTRAST  Result Date: 03/07/2023 CLINICAL DATA:  Acute right-sided abdominal pain, vomiting EXAM: CT ABDOMEN AND PELVIS WITH CONTRAST TECHNIQUE: Multidetector CT imaging of the abdomen and pelvis was performed using the standard protocol following bolus administration of intravenous contrast. RADIATION DOSE REDUCTION: This exam was performed according to the departmental dose-optimization program which includes automated exposure control, adjustment of the mA and/or kV according to patient size and/or use of iterative reconstruction technique. CONTRAST:  80mL OMNIPAQUE IOHEXOL 350 MG/ML SOLN COMPARISON:  None Available. FINDINGS: Lower chest: Heart is enlarged in size. There are small patchy infiltrates in both lower lung fields. Left hemidiaphragm is elevated. Hepatobiliary: There is 11 mm fluid density structure in the right lobe suggesting hepatic cyst. There is no dilation of bile ducts. Gallbladder is distended. There is fluid around the gallbladder. There are high density foci in the lumen of the gallbladder suggesting gallbladder stones. Pancreas: No focal abnormalities are seen. Spleen: Unremarkable.  Adrenals/Urinary Tract: Adrenals are unremarkable. There is no hydronephrosis. There are no renal or ureteral stones. There are few subcentimeter low-density foci in the kidneys suggesting possible renal cysts. Urinary bladder is not distended. Stomach/Bowel: Stomach is unremarkable. Small bowel loops are not dilated. The appendix is not distinctly visualized. In coronal image 46, there is a small caliber tubular structure in the margin of the cecum, possibly normal appendix. There is no pericecal inflammation. There is no significant wall thickening in colon. Scattered diverticula are seen without signs of focal diverticulitis. Vascular/Lymphatic: Scattered arterial calcifications  are seen. Reproductive: Uterus is not seen. Other: There is no ascites or pneumoperitoneum. Umbilical/paraumbilical hernia containing fat is seen. Musculoskeletal: Degenerative changes are noted in lumbar spine, more so at L4-L5 and L5-S1 levels with significant encroachment of neural foramina. IMPRESSION: Gallbladder is distended with pericholecystic fluid. Gallbladder stones are seen. Findings suggest acute cholecystitis. Please correlate with clinical and laboratory findings and consider gallbladder sonogram. There is no dilation of bile ducts. Small patchy infiltrates are seen in both lower lung fields suggesting subsegmental atelectasis. There is no evidence of intestinal obstruction or pneumoperitoneum. There is no hydronephrosis. Cysts seen in the liver and both kidneys. Diverticulosis of colon without signs of focal diverticulitis. Aortic arteriosclerosis. Lumbar spondylosis. Electronically Signed   By: Ernie Avena M.D.   On: 03/07/2023 12:43   DG Chest 1 View  Result Date: 03/07/2023 CLINICAL DATA:  Shortness of breath.  Chest pain EXAM: CHEST  1 VIEW COMPARISON:  Chest radiographs 06/20/2015 FINDINGS: Cardiac silhouette is at the upper limits of normal size, similar to prior. Mediastinal contours are within normal  limits. Mild bilateral lower lung linear likely subsegmental atelectasis. No focal airspace opacity to indicate pneumonia. No pulmonary edema, pleural effusion, or pneumothorax. No acute skeletal abnormality. IMPRESSION: Mild bilateral lower lung linear likely subsegmental atelectasis. Electronically Signed   By: Neita Garnet M.D.   On: 03/07/2023 11:26    ECG & Cardiac Imaging    RSR, 74, no acute ST/T changes - personally reviewed.  Assessment & Plan    1.  Acute cholecystitis:  pt presented w/ 24 hr h/o RUQ discomfort and n/v.  Imaging suggestive of cholecystitis/cholelithiasis.  Seen by surgery w/ rec for abx and perc cholecystostomy in setting of elevated hsTroponin (see below).  Echo pending.  If echo shows nl EF w/o regional wall motion abnormalities, we will hold heparin so that she may undergo perc procedure today.  If EF down, will need further cardiac eval prior to proceeding.  2.  Demand Ischemia:  pt w/o cardiac hx.  Notes chronic DOE but no h/o chest pain.  Presented w/ n/v/cholecystitis and found to have elevated hsTrops of 229  250  404  370.  ECG w/o acute ST/T changes.  Cor Ca2+ noted on abd imaging.  Hemodynamically stable.  Suspect demand ischemia in the setting of above.  Echo pending.  If EF nl, she may proceed w/ perc cholecystostomy however, will still need ischemic eval (nuc vs cor CTA) as outpt to risk stratify.  She is currently on heparin.  Further recs pending echo.  3.  HTN:  currently stable.  4.  HL:  on statin as outpt.  Signed, Nicolasa Ducking, NP 03/08/2023, 10:13 AM  For questions or updates, please contact   Please consult [email or web address] for contact info under Cardiology/STEMI.

## 2023-03-09 ENCOUNTER — Inpatient Hospital Stay: Payer: BLUE CROSS/BLUE SHIELD

## 2023-03-09 DIAGNOSIS — K810 Acute cholecystitis: Secondary | ICD-10-CM | POA: Diagnosis not present

## 2023-03-09 DIAGNOSIS — F32A Depression, unspecified: Secondary | ICD-10-CM | POA: Diagnosis not present

## 2023-03-09 DIAGNOSIS — R7989 Other specified abnormal findings of blood chemistry: Secondary | ICD-10-CM

## 2023-03-09 DIAGNOSIS — I10 Essential (primary) hypertension: Secondary | ICD-10-CM | POA: Diagnosis not present

## 2023-03-09 LAB — BASIC METABOLIC PANEL WITH GFR
Anion gap: 7 (ref 5–15)
BUN: 14 mg/dL (ref 8–23)
CO2: 29 mmol/L (ref 22–32)
Calcium: 8 mg/dL — ABNORMAL LOW (ref 8.9–10.3)
Chloride: 103 mmol/L (ref 98–111)
Creatinine, Ser: 0.87 mg/dL (ref 0.44–1.00)
GFR, Estimated: >60 mL/min (ref >60)
Glucose, Bld: 101 mg/dL — ABNORMAL HIGH (ref 70–99)
Potassium: 3.4 mmol/L — ABNORMAL LOW (ref 3.5–5.1)
Sodium: 139 mmol/L (ref 135–145)

## 2023-03-09 LAB — CBC
HCT: 45.4 % (ref 36.0–46.0)
Hemoglobin: 14.8 g/dL (ref 12.0–15.0)
MCH: 31.8 pg (ref 26.0–34.0)
MCHC: 32.6 g/dL (ref 30.0–36.0)
MCV: 97.4 fL (ref 80.0–100.0)
Platelets: 185 10*3/uL (ref 150–400)
RBC: 4.66 MIL/uL (ref 3.87–5.11)
RDW: 13.2 % (ref 11.5–15.5)
WBC: 11.2 10*3/uL — ABNORMAL HIGH (ref 4.0–10.5)
nRBC: 0 % (ref 0.0–0.2)

## 2023-03-09 MED ORDER — POTASSIUM CHLORIDE CRYS ER 20 MEQ PO TBCR
40.0000 meq | EXTENDED_RELEASE_TABLET | Freq: Once | ORAL | Status: AC
  Administered 2023-03-09: 40 meq via ORAL
  Filled 2023-03-09: qty 2

## 2023-03-09 MED ORDER — ATORVASTATIN CALCIUM 20 MG PO TABS
20.0000 mg | ORAL_TABLET | Freq: Every day | ORAL | Status: DC
  Administered 2023-03-09 – 2023-03-11 (×3): 20 mg via ORAL
  Filled 2023-03-09 (×3): qty 1

## 2023-03-09 MED ORDER — KETOROLAC TROMETHAMINE 30 MG/ML IJ SOLN
30.0000 mg | Freq: Four times a day (QID) | INTRAMUSCULAR | Status: DC | PRN
  Administered 2023-03-09 – 2023-03-11 (×6): 30 mg via INTRAVENOUS
  Filled 2023-03-09 (×6): qty 1

## 2023-03-09 NOTE — Progress Notes (Signed)
Cardiology Progress Note   Patient Name: Denise Huffman Date of Encounter: 03/09/2023  Primary Cardiologist: None - new  Subjective   Feels well this AM.  Abd feels much better.  No c/p or sob.  Inpatient Medications    Scheduled Meds:  ARIPiprazole  10 mg Oral Daily   buPROPion  150 mg Oral q morning   midodrine  10 mg Oral TID WC   sodium chloride flush  5 mL Intracatheter Q8H   traZODone  100 mg Oral QHS   Continuous Infusions:  sodium chloride 10 mL/hr at 03/09/23 0304   piperacillin-tazobactam (ZOSYN)  IV 3.375 g (03/09/23 0510)   PRN Meds: acetaminophen **OR** acetaminophen, hydrALAZINE, ipratropium-albuterol, morphine injection, ondansetron **OR** ondansetron (ZOFRAN) IV, oxyCODONE-acetaminophen, senna-docusate   Vital Signs    Vitals:   03/09/23 0405 03/09/23 0421 03/09/23 0733 03/09/23 0923  BP:   (!) 101/52 118/65  Pulse: 76  68 67  Resp:   20   Temp:   98.3 F (36.8 C)   TempSrc:   Oral   SpO2: (!) 89% 94% 92% 92%  Weight:      Height:        Intake/Output Summary (Last 24 hours) at 03/09/2023 1030 Last data filed at 03/09/2023 0900 Gross per 24 hour  Intake 2461.47 ml  Output 645 ml  Net 1816.47 ml   Filed Weights   03/07/23 0948  Weight: 90 kg    Physical Exam   GEN: Well nourished, well developed, in no acute distress.  HEENT: Grossly normal.  Neck: Supple, no JVD, carotid bruits, or masses. Cardiac: RRR, no murmurs, rubs, or gallops. No clubbing, cyanosis, edema.  Radials 2+, DP/PT 2+ and equal bilaterally.  Respiratory:  Respirations regular and unlabored, clear to auscultation bilaterally. GI: Soft, mild RUQ tenderness, nondistended, BS + x 4. MS: no deformity or atrophy. Skin: warm and dry, no rash. Neuro:  Strength and sensation are intact. Psych: AAOx3.  Normal affect.  Labs    Chemistry Recent Labs  Lab 03/07/23 1000 03/08/23 0431 03/08/23 1931 03/09/23 0402  NA 140 135 136 139  K 4.1 3.5 3.6 3.4*  CL 99 100 100 103  CO2  29 26 29 29   GLUCOSE 147* 140* 146* 101*  BUN 21 10 13 14   CREATININE 0.75 0.62 1.14* 0.87  CALCIUM 9.7 8.5* 8.0* 8.0*  PROT 7.4  --  6.1*  --   ALBUMIN 4.1  --  3.4*  --   AST 19  --  16  --   ALT 18  --  15  --   ALKPHOS 59  --  48  --   BILITOT 0.9  --  0.9  --   GFRNONAA >60 >60 54* >60  ANIONGAP 12 9 7 7      Hematology Recent Labs  Lab 03/08/23 0431 03/08/23 1931 03/09/23 0402  WBC 14.2* 11.1* 11.2*  RBC 4.87 4.54 4.66  HGB 15.7* 14.2 14.8  HCT 45.4 44.2 45.4  MCV 93.2 97.4 97.4  MCH 32.2 31.3 31.8  MCHC 34.6 32.1 32.6  RDW 12.8 13.0 13.2  PLT 214 183 185    Cardiac Enzymes  Recent Labs  Lab 03/07/23 1203 03/07/23 1656 03/07/23 1915 03/08/23 1931 03/08/23 2142  TROPONINIHS 250* 404* 370* 93* 76*      Radiology    S Abdomen Limited RUQ (LIVER/GB)  Result Date: 03/08/2023 CLINICAL DATA:  Acute cholecystitis. EXAM: ULTRASOUND ABDOMEN LIMITED RIGHT UPPER QUADRANT COMPARISON:  CT 03/07/2023 FINDINGS: Gallbladder:  Positive sonographic Murphy's sign. Gallbladder wall thickening. Multiple gallstones with dense posterior shadowing. A portion of the gallbladder wall appears sloughed into the lumen similar to CT. Common bile duct: Diameter: Normal at 4 mm Liver: Increased liver echogenicity no biliary duct dilatation portal vein is patent on color Doppler imaging with normal direction of blood flow towards the liver. Other: None. IMPRESSION: Ultrasound findings consistent acute cholecystitis. Electronically Signed   By: Genevive Bi M.D.   On: 03/08/2023 09:38   CT Angio Chest PE W and/or Wo Contrast  Result Date: 03/07/2023 CLINICAL DATA:  Right chest pain, hypoxia EXAM: CT ANGIOGRAPHY CHEST WITH CONTRAST TECHNIQUE: Multidetector CT imaging of the chest was performed using the standard protocol during bolus administration of intravenous contrast. Multiplanar CT image reconstructions and MIPs were obtained to evaluate the vascular anatomy. RADIATION DOSE REDUCTION:  This exam was performed according to the departmental dose-optimization program which includes automated exposure control, adjustment of the mA and/or kV according to patient size and/or use of iterative reconstruction technique. CONTRAST:  80mL OMNIPAQUE IOHEXOL 350 MG/ML SOLN COMPARISON:  Chest radiograph done today FINDINGS: Cardiovascular: There are no intraluminal filling defects in pulmonary artery branches. There is homogeneous enhancement in thoracic aorta. Scattered calcifications are seen in thoracic aorta. Small scattered coronary artery calcifications are seen. Heart is enlarged in size. Mediastinum/Nodes: No significant lymphadenopathy is seen. There are no inflammatory changes adjacent to the thoracic esophagus. Lungs/Pleura: There are small linear patchy infiltrates in both lower lung fields. Rest of the lung fields are clear. There is no significant pleural effusion. There is no pneumothorax. Upper Abdomen: Please refer to the report for CT abdomen and pelvis. Musculoskeletal: No acute findings are seen. Review of the MIP images confirms the above findings. IMPRESSION: There is no evidence of pulmonary embolism. There is no evidence of thoracic aortic dissection. Aortic arteriosclerosis. Small calcifications are seen in coronary artery branches. There are small linear patchy infiltrates in both lower lung fields suggesting atelectasis/pneumonia. Electronically Signed   By: Ernie Avena M.D.   On: 03/07/2023 12:47   CT ABDOMEN PELVIS W CONTRAST  Result Date: 03/07/2023 CLINICAL DATA:  Acute right-sided abdominal pain, vomiting EXAM: CT ABDOMEN AND PELVIS WITH CONTRAST TECHNIQUE: Multidetector CT imaging of the abdomen and pelvis was performed using the standard protocol following bolus administration of intravenous contrast. RADIATION DOSE REDUCTION: This exam was performed according to the departmental dose-optimization program which includes automated exposure control, adjustment of the mA  and/or kV according to patient size and/or use of iterative reconstruction technique. CONTRAST:  80mL OMNIPAQUE IOHEXOL 350 MG/ML SOLN COMPARISON:  None Available. FINDINGS: Lower chest: Heart is enlarged in size. There are small patchy infiltrates in both lower lung fields. Left hemidiaphragm is elevated. Hepatobiliary: There is 11 mm fluid density structure in the right lobe suggesting hepatic cyst. There is no dilation of bile ducts. Gallbladder is distended. There is fluid around the gallbladder. There are high density foci in the lumen of the gallbladder suggesting gallbladder stones. Pancreas: No focal abnormalities are seen. Spleen: Unremarkable. Adrenals/Urinary Tract: Adrenals are unremarkable. There is no hydronephrosis. There are no renal or ureteral stones. There are few subcentimeter low-density foci in the kidneys suggesting possible renal cysts. Urinary bladder is not distended. Stomach/Bowel: Stomach is unremarkable. Small bowel loops are not dilated. The appendix is not distinctly visualized. In coronal image 46, there is a small caliber tubular structure in the margin of the cecum, possibly normal appendix. There is no pericecal inflammation. There is no  significant wall thickening in colon. Scattered diverticula are seen without signs of focal diverticulitis. Vascular/Lymphatic: Scattered arterial calcifications are seen. Reproductive: Uterus is not seen. Other: There is no ascites or pneumoperitoneum. Umbilical/paraumbilical hernia containing fat is seen. Musculoskeletal: Degenerative changes are noted in lumbar spine, more so at L4-L5 and L5-S1 levels with significant encroachment of neural foramina. IMPRESSION: Gallbladder is distended with pericholecystic fluid. Gallbladder stones are seen. Findings suggest acute cholecystitis. Please correlate with clinical and laboratory findings and consider gallbladder sonogram. There is no dilation of bile ducts. Small patchy infiltrates are seen in both  lower lung fields suggesting subsegmental atelectasis. There is no evidence of intestinal obstruction or pneumoperitoneum. There is no hydronephrosis. Cysts seen in the liver and both kidneys. Diverticulosis of colon without signs of focal diverticulitis. Aortic arteriosclerosis. Lumbar spondylosis. Electronically Signed   By: Ernie Avena M.D.   On: 03/07/2023 12:43   DG Chest 1 View  Result Date: 03/07/2023 CLINICAL DATA:  Shortness of breath.  Chest pain EXAM: CHEST  1 VIEW COMPARISON:  Chest radiographs 06/20/2015 FINDINGS: Cardiac silhouette is at the upper limits of normal size, similar to prior. Mediastinal contours are within normal limits. Mild bilateral lower lung linear likely subsegmental atelectasis. No focal airspace opacity to indicate pneumonia. No pulmonary edema, pleural effusion, or pneumothorax. No acute skeletal abnormality. IMPRESSION: Mild bilateral lower lung linear likely subsegmental atelectasis. Electronically Signed   By: Neita Garnet M.D.   On: 03/07/2023 11:26    Telemetry    Non-tele  Cardiac Studies   2D Echocardiogram 8.10.2024  1. Left ventricular ejection fraction, by estimation, is 55 to 60%. The  left ventricle has normal function. The left ventricle has no regional  wall motion abnormalities. There is moderate concentric left ventricular  hypertrophy. Left ventricular  diastolic parameters were normal.   2. Right ventricular systolic function is normal. The right ventricular  size is normal. There is normal pulmonary artery systolic pressure.   3. The mitral valve is normal in structure. Trivial mitral valve  regurgitation. No evidence of mitral stenosis.   4. The aortic valve is tricuspid. Aortic valve regurgitation is not  visualized. No aortic stenosis is present.   5. The inferior vena cava is normal in size with greater than 50%  respiratory variability, suggesting right atrial pressure of 3 mmHg.  _____________   Patient Profile     61  y.o. female  with a history of GERD, HTN, HL, vertigo, and depression, who was admitted 8/9 w/ cholecystitis and was noted to have an elevated hsTrop.  Echo w/ nl EF.  S/p perc drain.  Assessment & Plan    1.  Acute cholecystitis:  s/p percutaneous drain 8/10.  Feels much better this AM.  Abx and f/u care per surgical and medical teams.  2.  Demand Ischemia:  In setting of above, HsTrop 229  250  404  370  93  76.  Echo w/ nl EF, mod conc LVH, no rwma, no significant valvular dzs.  BP dropped post-op in setting of fever - responded to IVF.  HsTrop trended down further.  Feels well this AM.  Chronic DOE as outpt.  No h/o angina.  Cor Ca2+ noted on imaging.  Resume home dose of statin @ discharge.  Add ASA when ok w/ surgery.  Will hold off on ? blocker at this time due to transient hypotension and ongoing soft BPs.  I've arranged for outpt cardiology f/u on 8/20.  In setting of demand ischemia,  will need outpt ischemic eval - nuc vs cor CTA.  3.  HTN/Hypotension:  transient hypotension post0op.  Currently stable though BPs soft.  Home antihypertensives on hold.  Look to d/c midodrine prior to discharge.  4.  HL:  On statin as outpt. LFTs wnl.  Resume in setting of #2.  5.  Hypokalemia:  3.4 this AM. Supp.  Signed, Nicolasa Ducking, NP  03/09/2023, 10:30 AM    For questions or updates, please contact   Please consult [email or web address] for contact info under Cardiology/STEMI.

## 2023-03-09 NOTE — Progress Notes (Signed)
Attempted to ambulate patient to room air. Patient SATs dropped to 76%/ patient back on 3 L. Sats are 92%. MS Sherryll Burger made aware.

## 2023-03-09 NOTE — TOC CM/SW Note (Signed)
Transition of Care [facility]) - Inpatient Brief Assessment   Patient Details  Name: MALAIKAH CHROBAK MRN: [id number] Date of Birth: 01-Jul-1961  [facility]) CM/SW Contact:    Kemper Durie, RN Phone Number: 03/09/2023, 10:32 AM   Clinical Narrative:  No TOC needs identified at this time.   Transition of Care Asessment: Insurance and Status: Insurance coverage has been reviewed Patient has primary care physician: Yes Home environment has been reviewed: Yes Prior level of function:: Independent Prior/Current Home Services: No current home services Social Determinants of Health Reivew: SDOH reviewed no interventions necessary Readmission risk has been reviewed: Yes Transition of care needs: no transition of care needs at this time

## 2023-03-09 NOTE — Progress Notes (Signed)
PROGRESS NOTE    Denise Huffman  ZOX:[id number] DOB: [birth year] DOA: 03/07/2023 PCP: [facility] (Confirm with patient/family/NH records and if not entered, this HAS to be entered at [facility] point of entry. "No PCP" if truly none.)   Brief Narrative: (Start on day 1 of progress note - keep it brief and live)  62 year old female with history of GERD, hyperlipidemia, depression, hypertension admitted for acute cholecystitis and elevated troponin admitted for acute cholecystitis   Assessment & Plan:   Principal Problem:   Acute cholecystitis Active Problems:   Hypertension   Elevated troponin   Hyperlipidemia   Leukocytosis   GERD (gastroesophageal reflux disease)   Depression   Pyuria  * Acute cholecystitis status post percutaneous cholecystostomy Continue with Zosyn per pharmacy for intra-abdominal infection Improving.  She did have transient fever and hypotension last evening but improved with IV fluids and symptomatic management. Advance to soft diet   Pyuria Present on admission, UA was positive for large leukocytes on admission Continue Zosyn which should cover possible urine infection   Depression Home bupropion 150 mg every morning, trazodone 100 mg nightly, Abilify 10 mg daily were resumed on admission   GERD (gastroesophageal reflux disease) Protonix    Leukocytosis Suspect secondary to acute cholecystitis, improving with treatment   Hyperlipidemia Holding home anticholesterol medications on admission   Elevated troponin Due to demand ischemia.  No MI Outpatient follow-up with cardiology for ischemic workup Echo within normal limits   Hypertension Amlodipine and lisinopril had to be discontinued due to hypotension   DVT prophylaxis: SCDs Place TED hose Start: 03/07/23 1414     Code Status: Full code Family Communication: (NO "discussed with patient") Disposition Plan: Possible discharge in next 2 to 3 days depending on clinical condition and postop  recovery   Consultants:  Surgery Cardiology IR  Procedures:  Percutaneous cholecystostomy  Antimicrobials:  Zosyn   Subjective:  Feeling much better after episode of transient hypotension and fever last evening status post cholecystostomy tube placement.  Objective: Vitals:   03/09/23 0405 03/09/23 0421 03/09/23 0733 03/09/23 0923  BP:   (!) 101/52 118/65  Pulse: 76  68 67  Resp:   20   Temp:   98.3 F (36.8 C)   TempSrc:   Oral   SpO2: (!) 89% 94% 92% 92%  Weight:      Height:        Intake/Output Summary (Last 24 hours) at 03/09/2023 1257 Last data filed at 03/09/2023 0900 Gross per 24 hour  Intake 2461.47 ml  Output 645 ml  Net 1816.47 ml   Filed Weights   03/07/23 0948  Weight: 90 kg    Examination:  General exam: Appears calm and comfortable  Respiratory system: Clear to auscultation. Respiratory effort normal. Cardiovascular system: S1 & S2 heard, RRR. No JVD, murmurs, rubs, gallops or clicks. No pedal edema. Gastrointestinal system: Abdomen is nondistended, soft and nontender. No organomegaly or masses felt. Normal bowel sounds heard.  She has drain with bilious output Central nervous system: Alert and oriented. No focal neurological deficits. Extremities: Symmetric 5 x 5 power. Skin: No rashes, lesions or ulcers Psychiatry: Judgement and insight appear normal. Mood & affect appropriate.     Data Reviewed: I have personally reviewed following labs and imaging studies  CBC: Recent Labs  Lab 03/07/23 1000 03/08/23 0431 03/08/23 1931 03/09/23 0402  WBC 14.4* 14.2* 11.1* 11.2*  NEUTROABS 12.5*  --  9.2*  --   HGB 16.2* 15.7* 14.2 14.8  HCT 48.7* 45.4 44.2 45.4  MCV 95.5 93.2 97.4 97.4  PLT 221 214 183 185   Basic Metabolic Panel: Recent Labs  Lab 03/07/23 1000 03/08/23 0431 03/08/23 1931 03/09/23 0402  NA 140 135 136 139  K 4.1 3.5 3.6 3.4*  CL 99 100 100 103  CO2 29 26 29 29   GLUCOSE 147* 140* 146* 101*  BUN 21 10 13 14    CREATININE 0.75 0.62 1.14* 0.87  CALCIUM 9.7 8.5* 8.0* 8.0*   GFR: Estimated Creatinine Clearance: 80.1 mL/min (by C-G formula based on SCr of 0.87 mg/dL). Liver Function Tests: Recent Labs  Lab 03/07/23 1000 03/08/23 1931  AST 19 16  ALT 18 15  ALKPHOS 59 48  BILITOT 0.9 0.9  PROT 7.4 6.1*  ALBUMIN 4.1 3.4*   Recent Labs  Lab 03/07/23 1000  LIPASE 28   No results for input(s): "AMMONIA" in the last 168 hours. Coagulation Profile: Recent Labs  Lab 03/07/23 1434 03/08/23 1931  INR 1.1 1.1    Sepsis Labs: Recent Labs  Lab 03/07/23 1314 03/07/23 1557 03/08/23 1931 03/08/23 2142  LATICACIDVEN 1.3 1.1 1.6 0.8    Recent Results (from the past 240 hour(s))  Resp panel by RT-PCR (RSV, Flu A&B, Covid) Anterior Nasal Swab     Status: None   Collection Time: 03/07/23  1:14 PM   Specimen: Anterior Nasal Swab  Result Value Ref Range Status   SARS Coronavirus 2 by RT PCR NEGATIVE NEGATIVE Final    Comment: (NOTE) SARS-CoV-2 target nucleic acids are NOT DETECTED.  The SARS-CoV-2 RNA is generally detectable in upper respiratory specimens during the acute phase of infection. The lowest concentration of SARS-CoV-2 viral copies this assay can detect is 138 copies/mL. A negative result does not preclude SARS-Cov-2 infection and should not be used as the sole basis for treatment or other patient management decisions. A negative result may occur with  improper specimen collection/handling, submission of specimen other than nasopharyngeal swab, presence of viral mutation(s) within the areas targeted by this assay, and inadequate number of viral copies(<138 copies/mL). A negative result must be combined with clinical observations, patient history, and epidemiological information. The expected result is Negative.  Fact Sheet for Patients:  BloggerCourse.com  Fact Sheet for Healthcare Providers:  SeriousBroker.it  This test  is no t yet approved or cleared by the [place] FDA and  has been authorized for detection and/or diagnosis of SARS-CoV-2 by FDA under an Emergency Use Authorization (EUA). This EUA will remain  in effect (meaning this test can be used) for the duration of the COVID-19 declaration under Section 564(b)(1) of the Act, 21 U.S.C.section 360bbb-3(b)(1), unless the authorization is terminated  or revoked sooner.       Influenza A by PCR NEGATIVE NEGATIVE Final   Influenza B by PCR NEGATIVE NEGATIVE Final    Comment: (NOTE) The Xpert Xpress SARS-CoV-2/FLU/RSV plus assay is intended as an aid in the diagnosis of influenza from Nasopharyngeal swab specimens and should not be used as a sole basis for treatment. Nasal washings and aspirates are unacceptable for Xpert Xpress SARS-CoV-2/FLU/RSV testing.  Fact Sheet for Patients: BloggerCourse.com  Fact Sheet for Healthcare Providers: SeriousBroker.it  This test is not yet approved or cleared by the [place] FDA and has been authorized for detection and/or diagnosis of SARS-CoV-2 by FDA under an Emergency Use Authorization (EUA). This EUA will remain in effect (meaning this test can be used) for the duration of the COVID-19 declaration under Section 564(b)(1) of  the Act, 21 U.S.C. section 360bbb-3(b)(1), unless the authorization is terminated or revoked.     Resp Syncytial Virus by PCR NEGATIVE NEGATIVE Final    Comment: (NOTE) Fact Sheet for Patients: BloggerCourse.com  Fact Sheet for Healthcare Providers: SeriousBroker.it  This test is not yet approved or cleared by the [place] FDA and has been authorized for detection and/or diagnosis of SARS-CoV-2 by FDA under an Emergency Use Authorization (EUA). This EUA will remain in effect (meaning this test can be used) for the duration of the COVID-19 declaration under Section  564(b)(1) of the Act, 21 U.S.C. section 360bbb-3(b)(1), unless the authorization is terminated or revoked.  Performed at [facility], [address] [place], [place] [address]   Blood Culture (routine x 2)     Status: None (Preliminary result)   Collection Time: 03/07/23  1:19 PM   Specimen: BLOOD  Result Value Ref Range Status   Specimen Description BLOOD LEFT ANTECUBITAL  Final   Special Requests   Final    BOTTLES DRAWN AEROBIC AND ANAEROBIC Blood Culture adequate volume   Culture   Final    NO GROWTH 2 DAYS Performed at [facility], [address], [place], [place] [address]    Report Status PENDING  Incomplete  Blood Culture (routine x 2)     Status: None (Preliminary result)   Collection Time: 03/07/23  1:46 PM   Specimen: BLOOD  Result Value Ref Range Status   Specimen Description BLOOD RIGHT ANTECUBITAL  Final   Special Requests   Final    BOTTLES DRAWN AEROBIC AND ANAEROBIC Blood Culture adequate volume   Culture   Final    NO GROWTH 2 DAYS Performed at [facility], [address], [place], [place] [address]    Report Status PENDING  Incomplete         Radiology Studies: ECHOCARDIOGRAM COMPLETE  Result Date: 03/08/2023    ECHOCARDIOGRAM REPORT   Patient Name:   Denise Huffman Date of Exam: 03/08/2023 Medical Rec #:  [id number]  Height:       69.0 in Accession #:    [id number] Weight:       198.4 lb Date of Birth:  December 30, 1960  BSA:          2.059 m Patient Age:    62 years   BP:           157/80 mmHg Patient Gender: F          HR:           63 bpm. Exam Location:  [facility] Procedure: 2D Echo and Strain Analysis Indications:     Dyspnea R06.00                  Chest Pain R07.9  History:         Patient has no prior history of Echocardiogram examinations.  Sonographer:     Overton Mam RDCS, FASE Referring Phys:  [id number] AMY N Edelstein Diagnosing Phys: Jodelle Red MD  Sonographer Comments: Global longitudinal strain was attempted.  IMPRESSIONS  1. Left ventricular ejection fraction, by estimation, is 55 to 60%. The left ventricle has normal function. The left ventricle has no regional wall motion abnormalities. There is moderate concentric left ventricular hypertrophy. Left ventricular diastolic parameters were normal.  2. Right ventricular systolic function is normal. The right ventricular size is normal. There is normal pulmonary artery systolic pressure.  3. The mitral valve is normal in structure. Trivial mitral valve  regurgitation. No evidence of mitral stenosis.  4. The aortic valve is tricuspid. Aortic valve regurgitation is not visualized. No aortic stenosis is present.  5. The inferior vena cava is normal in size with greater than 50% respiratory variability, suggesting right atrial pressure of 3 mmHg. Comparison(s): No prior Echocardiogram. Conclusion(s)/Recommendation(s): Normal biventricular function without evidence of hemodynamically significant valvular heart disease. FINDINGS  Left Ventricle: Left ventricular ejection fraction, by estimation, is 55 to 60%. The left ventricle has normal function. The left ventricle has no regional wall motion abnormalities. The global longitudinal strain is normal despite suboptimal segment tracking. The left ventricular internal cavity size was normal in size. There is moderate concentric left ventricular hypertrophy. Left ventricular diastolic parameters were normal. Right Ventricle: The right ventricular size is normal. Right vetricular wall thickness was not well visualized. Right ventricular systolic function is normal. There is normal pulmonary artery systolic pressure. The tricuspid regurgitant velocity is 2.42 m/s, and with an assumed right atrial pressure of 3 mmHg, the estimated right ventricular systolic pressure is 26.4 mmHg. Left Atrium: Left atrial size was normal in size. Right Atrium: Right atrial size was normal in size. Pericardium: There is no evidence of pericardial effusion.  Mitral Valve: The mitral valve is normal in structure. Trivial mitral valve regurgitation. No evidence of mitral valve stenosis. Tricuspid Valve: The tricuspid valve is normal in structure. Tricuspid valve regurgitation is trivial. No evidence of tricuspid stenosis. Aortic Valve: The aortic valve is tricuspid. Aortic valve regurgitation is not visualized. No aortic stenosis is present. Aortic valve peak gradient measures 11.3 mmHg. Pulmonic Valve: The pulmonic valve was not well visualized. Pulmonic valve regurgitation is not visualized. No evidence of pulmonic stenosis. Aorta: The aortic root, ascending aorta and aortic arch are all structurally normal, with no evidence of dilitation or obstruction. Venous: The inferior vena cava is normal in size with greater than 50% respiratory variability, suggesting right atrial pressure of 3 mmHg. IAS/Shunts: The atrial septum is grossly normal.  LEFT VENTRICLE PLAX 2D LVIDd:         4.70 cm   Diastology LVIDs:         3.30 cm   LV e' medial:    6.20 cm/s LV PW:         1.30 cm   LV E/e' medial:  8.4 LV IVS:        1.40 cm   LV e' lateral:   7.18 cm/s LVOT diam:     2.00 cm   LV E/e' lateral: 7.2 LV SV:         67 LV SV Index:   32 LVOT Area:     3.14 cm  RIGHT VENTRICLE RV Basal diam:  3.30 cm RV S prime:     12.40 cm/s TAPSE (M-mode): 1.6 cm LEFT ATRIUM             Index        RIGHT ATRIUM           Index LA diam:        4.20 cm 2.04 cm/m   RA Area:     15.20 cm LA Vol (A2C):   42.0 ml 20.40 ml/m  RA Volume:   38.80 ml  18.84 ml/m LA Vol (A4C):   34.9 ml 16.95 ml/m LA Biplane Vol: 38.4 ml 18.65 ml/m  AORTIC VALVE                 PULMONIC VALVE AV Area (Vmax): 2.24 cm  PV Vmax:        1.04 m/s AV Vmax:        168.00 cm/s  PV Peak grad:   4.3 mmHg AV Peak Grad:   11.3 mmHg    RVOT Peak grad: 3 mmHg LVOT Vmax:      120.00 cm/s LVOT Vmean:     76.600 cm/s LVOT VTI:       0.213 m  AORTA Ao Root diam: 3.20 cm Ao Asc diam:  3.20 cm MITRAL VALVE               TRICUSPID  VALVE MV Area (PHT): 2.58 cm    TR Peak grad:   23.4 mmHg MV Decel Time: 294 msec    TR Vmax:        242.00 cm/s MV E velocity: 51.90 cm/s MV A velocity: 72.80 cm/s  SHUNTS MV E/A ratio:  0.71        Systemic VTI:  0.21 m                            Systemic Diam: 2.00 cm Jodelle Red MD Electronically signed by Jodelle Red MD Signature Date/Time: 03/08/2023/10:31:33 AM    Final    US Abdomen Limited RUQ (LIVER/GB)  Result Date: 03/08/2023 CLINICAL DATA:  Acute cholecystitis. EXAM: ULTRASOUND ABDOMEN LIMITED RIGHT UPPER QUADRANT COMPARISON:  CT 03/07/2023 FINDINGS: Gallbladder: Positive sonographic Murphy's sign. Gallbladder wall thickening. Multiple gallstones with dense posterior shadowing. A portion of the gallbladder wall appears sloughed into the lumen similar to CT. Common bile duct: Diameter: Normal at 4 mm Liver: Increased liver echogenicity no biliary duct dilatation portal vein is patent on color Doppler imaging with normal direction of blood flow towards the liver. Other: None. IMPRESSION: Ultrasound findings consistent acute cholecystitis. Electronically Signed   By: Genevive Bi M.D.   On: 03/08/2023 09:38        Scheduled Meds:  ARIPiprazole  10 mg Oral Daily   atorvastatin  20 mg Oral Daily   buPROPion  150 mg Oral q morning   midodrine  10 mg Oral TID WC   sodium chloride flush  5 mL Intracatheter Q8H   traZODone  100 mg Oral QHS   Continuous Infusions:  sodium chloride 10 mL/hr at 03/09/23 0304   piperacillin-tazobactam (ZOSYN)  IV 3.375 g (03/09/23 1252)     LOS: 2 days    Time spent: 35 minutes    Anyae Griffith Sherryll Burger, MD [facility] Pager [phone]  If 7PM-7AM, please contact night-coverage [email or web address] 03/09/2023, 12:57 PM

## 2023-03-09 NOTE — Progress Notes (Signed)
Patient ID: Denise Huffman, female   DOB: 10/02/1960, 62 y.o.   MRN: [id number]     SURGICAL PROGRESS NOTE   Hospital Day(s): 2.   Interval History: Patient seen and examined, no acute events or new complaints overnight. Patient reports feeling better this morning.  She endorses feeling soreness on the drain area.  No significant abdominal pain.  No nausea or vomiting.  Vital sign has been with stable blood pressure overnight.  No fever or tachycardia.  Vital signs in last 24 hours: [min-max] current  Temp:  [97.9 F (36.6 C)-99.1 F (37.3 C)] 98.3 F (36.8 C) (08/11 0733) Pulse Rate:  [0-86] 68 (08/11 0733) Resp:  [11-31] 20 (08/11 0733) BP: (82-158)/(49-113) 101/52 (08/11 0733) SpO2:  [87 %-95 %] 92 % (08/11 0733)     Height: 5\' 9"  (175.3 cm) Weight: 90 kg BMI (Calculated): 29.29   Physical Exam:  Constitutional: alert, cooperative and no distress  Respiratory: breathing non-labored at rest  Cardiovascular: regular rate and sinus rhythm  Gastrointestinal: soft, non-tender, and non-distended.  Drain with bilious output  Labs:     Latest Ref Rng & Units 03/09/2023    4:02 AM 03/08/2023    7:31 PM 03/08/2023    4:31 AM  CBC  WBC 4.0 - 10.5 K/uL 11.2  11.1  14.2   Hemoglobin 12.0 - 15.0 g/dL 40.9  81.1  91.4   Hematocrit 36.0 - 46.0 % 45.4  44.2  45.4   Platelets 150 - 400 K/uL 185  183  214       Latest Ref Rng & Units 03/09/2023    4:02 AM 03/08/2023    7:31 PM 03/08/2023    4:31 AM  CMP  Glucose 70 - 99 mg/dL 782  956  213   BUN 8 - 23 mg/dL 14  13  10    Creatinine 0.44 - 1.00 mg/dL 0.86  5.78  4.69   Sodium 135 - 145 mmol/L 139  136  135   Potassium 3.5 - 5.1 mmol/L 3.4  3.6  3.5   Chloride 98 - 111 mmol/L 103  100  100   CO2 22 - 32 mmol/L 29  29  26    Calcium 8.9 - 10.3 mg/dL 8.0  8.0  8.5   Total Protein 6.5 - 8.1 g/dL  6.1    Total Bilirubin 0.3 - 1.2 mg/dL  0.9    Alkaline Phos 38 - 126 U/L  48    AST 15 - 41 U/L  16    ALT 0 - 44 U/L  15      Imaging studies: No  new pertinent imaging studies   Assessment/Plan:  62 y.o. female with acute cholecystitis status post percutaneous cholecystostomy, complicated by pertinent comorbidities including elevated troponins and hypoxemia on admission.   -Cardiac pathology was ruled out yesterday -Patient underwent percutaneous cholecystostomy yesterday.  Today with improved white blood cell count -She had transiently fever and hypotension last night but the was resolved with IV fluids and antibiotic therapy -This morning with stable vital signs.  Will advance diet to soft diet. -Recommend to continue observation for at least 24 hours to make sure that the vital signs continue stable -Wean off oxygen as tolerated -Surgery will continue to follow.  I do not foresee any surgical intervention during this admission.  Gae Gallop, MD

## 2023-03-10 ENCOUNTER — Other Ambulatory Visit: Payer: Self-pay | Admitting: Radiology

## 2023-03-10 DIAGNOSIS — K810 Acute cholecystitis: Secondary | ICD-10-CM

## 2023-03-10 LAB — PHOSPHORUS: Phosphorus: 2.8 mg/dL (ref 2.5–4.6)

## 2023-03-10 LAB — MAGNESIUM: Magnesium: 2.5 mg/dL — ABNORMAL HIGH (ref 1.7–2.4)

## 2023-03-10 LAB — VITAMIN D 25 HYDROXY (VIT D DEFICIENCY, FRACTURES): Vit D, 25-Hydroxy: 64.17 ng/mL (ref 30–100)

## 2023-03-10 MED ORDER — ALPRAZOLAM 0.25 MG PO TABS
0.2500 mg | ORAL_TABLET | Freq: Three times a day (TID) | ORAL | Status: DC | PRN
  Administered 2023-03-10 (×2): 0.25 mg via ORAL
  Filled 2023-03-10 (×2): qty 1

## 2023-03-10 MED ORDER — GABAPENTIN 300 MG PO CAPS
300.0000 mg | ORAL_CAPSULE | Freq: Every day | ORAL | Status: DC
  Administered 2023-03-10: 300 mg via ORAL
  Filled 2023-03-10: qty 1

## 2023-03-10 MED ORDER — ENOXAPARIN SODIUM 40 MG/0.4ML IJ SOSY
40.0000 mg | PREFILLED_SYRINGE | Freq: Every evening | INTRAMUSCULAR | Status: DC
  Administered 2023-03-10: 40 mg via SUBCUTANEOUS
  Filled 2023-03-10: qty 0.4

## 2023-03-10 NOTE — Progress Notes (Signed)
Patient ID: Denise Huffman, female   DOB: 1960-08-26, 62 y.o.   MRN: [id number]     SURGICAL PROGRESS NOTE   Hospital Day(s): 3.   Interval History: Patient seen and examined, no acute events or new complaints overnight. Patient reports continue with pain controlled.  Denies any nausea or vomiting.  Denies any issues with current diet.  Concerned due to cough.  Vital signs in last 24 hours: [min-max] current  Temp:  [97.8 F (36.6 C)-98.3 F (36.8 C)] 98.1 F (36.7 C) (08/12 0340) Pulse Rate:  [60-68] 62 (08/12 0340) Resp:  [16-20] 16 (08/12 0340) BP: (97-118)/(52-69) 103/64 (08/12 0340) SpO2:  [92 %-94 %] 94 % (08/12 0340)     Height: 5\' 9"  (175.3 cm) Weight: 90 kg BMI (Calculated): 29.29   Physical Exam:  Constitutional: alert, cooperative and no distress  Respiratory: breathing non-labored at rest  Cardiovascular: regular rate and sinus rhythm  Gastrointestinal: soft, non-tender, and non-distended  Labs:     Latest Ref Rng & Units 03/10/2023    3:46 AM 03/09/2023    4:02 AM 03/08/2023    7:31 PM  CBC  WBC 4.0 - 10.5 K/uL 7.4  11.2  11.1   Hemoglobin 12.0 - 15.0 g/dL 34.7  42.5  95.6   Hematocrit 36.0 - 46.0 % 40.6  45.4  44.2   Platelets 150 - 400 K/uL 170  185  183       Latest Ref Rng & Units 03/10/2023    3:46 AM 03/09/2023    4:02 AM 03/08/2023    7:31 PM  CMP  Glucose 70 - 99 mg/dL 88  387  564   BUN 8 - 23 mg/dL 14  14  13    Creatinine 0.44 - 1.00 mg/dL 3.32  9.51  8.84   Sodium 135 - 145 mmol/L 140  139  136   Potassium 3.5 - 5.1 mmol/L 3.7  3.4  3.6   Chloride 98 - 111 mmol/L 105  103  100   CO2 22 - 32 mmol/L 29  29  29    Calcium 8.9 - 10.3 mg/dL 8.4  8.0  8.0   Total Protein 6.5 - 8.1 g/dL   6.1   Total Bilirubin 0.3 - 1.2 mg/dL   0.9   Alkaline Phos 38 - 126 U/L   48   AST 15 - 41 U/L   16   ALT 0 - 44 U/L   15     Imaging studies: No new pertinent imaging studies   Assessment/Plan:  62 y.o. female with acute cholecystitis status post percutaneous  cholecystostomy, complicated by pertinent comorbidities including elevated troponins and hypoxemia on admission.   Acute cholecystitis -Currently being treated with percutaneous cholecystectomy and IV antibiotic therapy -Responding well to current management -White blood cell count normal today at 7.4 -Continue antibiotic therapy.  May transition to oral antibiotic therapy -Patient will keep cholecystostomy tube for 6 to 8 weeks -Will coordinate follow-up with IR for further evaluation -No contraindication for aspirin therapy   Hypoxia and low blood pressure -Low blood pressure questionable if it is from sepsis from cholecystitis versus pneumonia. -The pneumonia also can explain the hypoxia the patient has been intermittently presenting -As per nurse, last night patient sat dropped to 76% on room air. -She has been responding adequately to oxygen therapy -Continue management as per primary team  Gae Gallop, MD

## 2023-03-10 NOTE — Progress Notes (Signed)
[facility] Progress Note  Patient: Denise Huffman    UYQ:[id number]  DOA: 03/07/2023     Date of Service: the patient was seen and examined on 03/10/2023  Chief Complaint  Patient presents with   Chest Pain   Brief hospital course:  62 year old female with history of GERD, hyperlipidemia, depression, hypertension admitted for acute cholecystitis and elevated troponin admitted for acute cholecystitis    Assessment and Plan:  Acute cholecystitis status post percutaneous cholecystostomy Continue with Zosyn per pharmacy for intra-abdominal infection Improving.  She did have transient fever and hypotension, improved with IV fluids and symptomatic management. Leukocytosis resolved Advance to soft diet Start aspirin when cleared by general surgery  Pyuria Present on admission, UA was positive for large leukocytes on admission Continue Zosyn which should cover possible urine infection   Acute hypoxic respiratory failure CXR: Bibasilar atelectasis/scarring. Pneumonia at the left lung base is not excluded Supplemental O2 relation has been weaned off, currently patient is saturating 91% at rest on room air   Depression Home bupropion 150 mg every morning, trazodone 100 mg nightly, Abilify 10 mg daily were resumed on admission   GERD (gastroesophageal reflux disease) Protonix      Hyperlipidemia Holding home anticholesterol medications on admission   Elevated troponin Due to demand ischemia.  No MI Cardiology consulted, recommended outpatient follow-up for ischemic workup Echo within normal limits Start aspirin when cleared by general surgery  Hypertension Amlodipine and lisinopril had to be discontinued due to hypotension Continue midodrine 10 mg p.o. 3 times daily Monitor BP and titrate medications accordingly   Body mass index is 29.3 kg/m.  Interventions:  Diet: Soft diet DVT Prophylaxis: Subcutaneous Lovenox   Advance goals of care discussion: Full  code  Family Communication: family was not present at bedside, at the time of interview.  The pt provided permission to discuss medical plan with the family. Opportunity was given to ask question and all questions were answered satisfactorily.   Disposition:  Pt is from home, admitted with acute cholecystitis, status post cholecystostomy tube insertion, developed hypotension on midodrine, which precludes a safe discharge. Discharge to home, when clinically stable, may need 1-2 more days to stay in the hospital.  Subjective: No significant events overnight, patient blood pressure is stable on midodrine, we will continue monitor.  Pain is under control 5-6/10, patient is complaining of mild cough with phlegm production, no any other complaints  Physical Exam: General: NAD, lying comfortably Appear in no distress, affect appropriate Eyes: PERRLA ENT: Oral Mucosa Clear, moist  Neck: no JVD,  Cardiovascular: S1 and S2 Present, no Murmur,  Respiratory: good respiratory effort, Bilateral Air entry equal and Decreased, no Crackles, no wheezes Abdomen: Bowel Sound present, Soft and RUQ tenderness, cholecystostomy drain intact Skin: no rashes Extremities: no Pedal edema, no calf tenderness Neurologic: without any new focal findings Gait not checked due to patient safety concerns  Vitals:   03/09/23 1607 03/09/23 2006 03/10/23 0340 03/10/23 0841  BP: (!) 97/52 115/69 103/64 111/71  Pulse: 68 60 62 (!) 59  Resp: 16 16 16 18   Temp: 97.8 F (36.6 C) 97.9 F (36.6 C) 98.1 F (36.7 C) 98 F (36.7 C)  TempSrc: Oral Oral Oral   SpO2: 94% 93% 94% 93%  Weight:      Height:        Intake/Output Summary (Last 24 hours) at 03/10/2023 1533 Last data filed at 03/10/2023 1200 Gross per 24 hour  Intake 605 ml  Output 175 ml  Net 430 ml   Filed Weights   03/07/23 0948  Weight: 90 kg    Data Reviewed: I have personally reviewed and interpreted daily labs, tele strips, imagings as discussed  above. I reviewed all nursing notes, pharmacy notes, vitals, pertinent old records I have discussed plan of care as described above with RN and patient/family.  CBC: Recent Labs  Lab 03/07/23 1000 03/08/23 0431 03/08/23 1931 03/09/23 0402 03/10/23 0346  WBC 14.4* 14.2* 11.1* 11.2* 7.4  NEUTROABS 12.5*  --  9.2*  --   --   HGB 16.2* 15.7* 14.2 14.8 13.4  HCT 48.7* 45.4 44.2 45.4 40.6  MCV 95.5 93.2 97.4 97.4 95.8  PLT 221 214 183 185 170   Basic Metabolic Panel: Recent Labs  Lab 03/07/23 1000 03/08/23 0431 03/08/23 1931 03/09/23 0402 03/10/23 0346  NA 140 135 136 139 140  K 4.1 3.5 3.6 3.4* 3.7  CL 99 100 100 103 105  CO2 29 26 29 29 29   GLUCOSE 147* 140* 146* 101* 88  BUN 21 10 13 14 14   CREATININE 0.75 0.62 1.14* 0.87 0.80  CALCIUM 9.7 8.5* 8.0* 8.0* 8.4*  MG  --   --   --   --  2.5*  PHOS  --   --   --   --  2.8    Studies: DG Chest Port 1 View  Result Date: 03/09/2023 CLINICAL DATA:  Fever. EXAM: PORTABLE CHEST 1 VIEW COMPARISON:  Chest radiograph dated 03/07/2023. FINDINGS: There are bibasilar streaky atelectasis/scarring. Faint density at the left lung base noted. Pneumonia is not excluded. There is no pleural effusion pneumothorax. The cardiac silhouette is within normal limits. No acute osseous pathology. IMPRESSION: Bibasilar atelectasis/scarring. Pneumonia at the left lung base is not excluded. Electronically Signed   By: Elgie Collard M.D.   On: 03/09/2023 17:31    Scheduled Meds:  ARIPiprazole  10 mg Oral Daily   atorvastatin  20 mg Oral Daily   buPROPion  150 mg Oral q morning   midodrine  10 mg Oral TID WC   sodium chloride flush  5 mL Intracatheter Q8H   traZODone  100 mg Oral QHS   Continuous Infusions:  piperacillin-tazobactam (ZOSYN)  IV 3.375 g (03/10/23 1351)   PRN Meds: acetaminophen **OR** acetaminophen, ALPRAZolam, hydrALAZINE, ipratropium-albuterol, ketorolac, ondansetron **OR** ondansetron (ZOFRAN) IV, senna-docusate  Time spent: 35  minutes  Author: Gillis Santa. MD [facility] 03/10/2023 3:33 PM  To reach On-call, see care teams to locate the attending and reach out to them via [email or web address]. If 7PM-7AM, please contact night-coverage If you still have difficulty reaching the attending provider, please page the [facility] (Director on Call) for [facility] on amion for assistance.

## 2023-03-10 NOTE — Progress Notes (Signed)
Pharmacy Antibiotic Note  Denise Huffman is a 62 y.o. female admitted on 03/07/2023 with  Intra-abdominal infection .  Pharmacy has been consulted for Zosyn dosing.  Patient presenting with complaints of right sided lower chest/upper abdominal pain and multiple episodes of vomiting. In ED, patient is afebrile with WBC 14.4. CT of abdomen shows acute cholecystitis.  Plan: Continue Zosyn 3.375 g IV every 8 hours based on current renal function Monitor renal function, clinical status, culture data, and LOT F/u possible surgical intervention  Height: 5\' 9"  (175.3 cm) Weight: 90 kg (198 lb 6.6 oz) IBW/kg (Calculated) : 66.2  Temp (24hrs), Avg:98 F (36.7 C), Min:97.8 F (36.6 C), Max:98.1 F (36.7 C)  Recent Labs  Lab 03/07/23 1000 03/07/23 1314 03/07/23 1557 03/08/23 0431 03/08/23 1931 03/08/23 2142 03/09/23 0402 03/10/23 0346  WBC 14.4*  --   --  14.2* 11.1*  --  11.2* 7.4  CREATININE 0.75  --   --  0.62 1.14*  --  0.87 0.80  LATICACIDVEN  --  1.3 1.1  --  1.6 0.8  --   --     Estimated Creatinine Clearance: 87.1 mL/min (by C-G formula based on SCr of 0.8 mg/dL).    No Known Allergies  Antimicrobials this admission: Zosyn 8/9 >>   Dose adjustments this admission: N/A  Microbiology results: 8/9 BCx: ngtd  Thank you for involving pharmacy in this patient's care.   Barrie Folk, PharmD Clinical Pharmacist 03/10/2023 12:36 PM

## 2023-03-11 DIAGNOSIS — K810 Acute cholecystitis: Secondary | ICD-10-CM | POA: Diagnosis not present

## 2023-03-11 MED ORDER — MIDODRINE HCL 5 MG PO TABS: 5.0000 mg | ORAL_TABLET | Freq: Three times a day (TID) | ORAL | Status: DC | PRN

## 2023-03-11 MED ORDER — OXYCODONE HCL 5 MG PO TABS: 5.0000 mg | ORAL_TABLET | Freq: Four times a day (QID) | ORAL | 0 refills | Status: AC | PRN

## 2023-03-11 MED ORDER — AMOXICILLIN-POT CLAVULANATE 875-125 MG PO TABS: 1.0000 | ORAL_TABLET | Freq: Two times a day (BID) | ORAL | Status: DC

## 2023-03-11 MED ORDER — OXYCODONE HCL 5 MG PO TABS
5.0000 mg | ORAL_TABLET | Freq: Four times a day (QID) | ORAL | Status: DC | PRN
  Administered 2023-03-11 (×2): 5 mg via ORAL
  Filled 2023-03-11 (×2): qty 1

## 2023-03-11 MED ORDER — POTASSIUM CHLORIDE CRYS ER 20 MEQ PO TBCR
40.0000 meq | EXTENDED_RELEASE_TABLET | Freq: Once | ORAL | Status: AC
  Administered 2023-03-11: 40 meq via ORAL
  Filled 2023-03-11: qty 2

## 2023-03-11 MED ORDER — AMOXICILLIN-POT CLAVULANATE 875-125 MG PO TABS: 1.0000 | ORAL_TABLET | Freq: Two times a day (BID) | ORAL | 0 refills | Status: AC

## 2023-03-11 MED ORDER — ASPIRIN 81 MG PO TBEC
81.0000 mg | DELAYED_RELEASE_TABLET | Freq: Every day | ORAL | Status: DC
  Administered 2023-03-11: 81 mg via ORAL
  Filled 2023-03-11: qty 1

## 2023-03-11 MED ORDER — ASPIRIN 81 MG PO TBEC: 81.0000 mg | DELAYED_RELEASE_TABLET | Freq: Every day | ORAL | 12 refills | Status: AC

## 2023-03-11 MED ORDER — MIDODRINE HCL 5 MG PO TABS: 5.0000 mg | ORAL_TABLET | Freq: Three times a day (TID) | ORAL | 0 refills | Status: AC | PRN

## 2023-03-11 NOTE — Progress Notes (Signed)
Patient ID: Denise Huffman, female   DOB: Nov 08, 1960, 62 y.o.   MRN: [id number]     SURGICAL PROGRESS NOTE   Hospital Day(s): 4.   Interval History: Patient seen and examined, no acute events or new complaints overnight. Patient reports feeling better this morning.  Patient endorses that she is more comfortable.  She denies abdominal pain.  She is tolerating diet.  She denies chest pain or shortness of breath.  Vital signs in last 24 hours: [min-max] current  Temp:  [98 F (36.7 C)-98.8 F (37.1 C)] 98.8 F (37.1 C) (08/13 0714) Pulse Rate:  [59-63] 63 (08/13 0714) Resp:  [16-20] 18 (08/13 0714) BP: (112-148)/(65-70) 122/70 (08/13 0714) SpO2:  [89 %-93 %] 91 % (08/13 0714)     Height: 5\' 9"  (175.3 cm) Weight: 90 kg BMI (Calculated): 29.29   Physical Exam:  Constitutional: alert, cooperative and no distress  Respiratory: breathing non-labored at rest  Cardiovascular: regular rate and sinus rhythm  Gastrointestinal: soft, non-tender, and non-distended  Labs:     Latest Ref Rng & Units 03/11/2023    4:59 AM 03/10/2023    3:46 AM 03/09/2023    4:02 AM  CBC  WBC 4.0 - 10.5 K/uL 5.6  7.4  11.2   Hemoglobin 12.0 - 15.0 g/dL 13.2  44.0  10.2   Hematocrit 36.0 - 46.0 % 39.2  40.6  45.4   Platelets 150 - 400 K/uL 176  170  185       Latest Ref Rng & Units 03/11/2023    4:59 AM 03/10/2023    3:46 AM 03/09/2023    4:02 AM  CMP  Glucose 70 - 99 mg/dL 94  88  725   BUN 8 - 23 mg/dL 18  14  14    Creatinine 0.44 - 1.00 mg/dL 3.66  4.40  3.47   Sodium 135 - 145 mmol/L 141  140  139   Potassium 3.5 - 5.1 mmol/L 3.3  3.7  3.4   Chloride 98 - 111 mmol/L 106  105  103   CO2 22 - 32 mmol/L 27  29  29    Calcium 8.9 - 10.3 mg/dL 8.6  8.4  8.0     Imaging studies: No new pertinent imaging studies   Assessment/Plan:  62 y.o. female with acute cholecystitis status post percutaneous cholecystostomy, complicated by pertinent comorbidities including elevated troponins and hypoxemia on admission.     Acute cholecystitis -Currently being treated with percutaneous cholecystectomy and IV antibiotic therapy -Responding well to current management -White blood cell count normal today at 5.6 -Continue antibiotic therapy.  May transition to oral antibiotic therapy -Patient will keep cholecystostomy tube for 6 to 8 weeks -Will coordinate follow-up with IR for further evaluation -No contraindication for aspirin therapy -May be discharged from from cholecystitis standpoint once stable from pneumonia and hypoxemia. -Blood pressure also seems to be stable   Gae Gallop, MD

## 2023-03-11 NOTE — Discharge Summary (Signed)
[facility] Discharge Summary   Patient: Denise Huffman ZHY:[id number]  PCP: [facility]  Date of admission: 03/07/2023   Date of discharge:  03/11/2023     Discharge Diagnoses:  Principal Problem:   Acute cholecystitis Active Problems:   Hypertension   Elevated troponin   Hyperlipidemia   Leukocytosis   GERD (gastroesophageal reflux disease)   Depression   Pyuria   Admitted From: Home Disposition:  Home   Recommendations for Outpatient Follow-up:  Follow-up with PCP in 1 week, repeat chest x-ray after 4 weeks for resolution of pneumonia Follow-up with general surgery in 1 week Follow with IR in 6 to 8 weeks Follow up LABS/TEST:  As above   Follow-up Information     Creig Hines, NP Follow up on 03/18/2023.   Specialties: Cardiology, Radiology Why: 9:50 AM Contact information: [address] [place] [address] [phone]                Diet recommendation: Cardiac diet  Activity: The patient is advised to gradually reintroduce usual activities, as tolerated  Discharge Condition: stable  Code Status: Full code   History of present illness: As per the H and P dictated on admission Hospital Course:  62 year old female with history of GERD, hyperlipidemia, depression, hypertension admitted for acute cholecystitis and elevated troponin admitted for acute cholecystitis   Assessment and Plan: # Acute cholecystitis s/p percutaneous cholecystostomy S/p Zosyn per pharmacy for intra-abdominal infection.  Patient had transient fever and hypotension which improved.  Leukocytosis resolved.  Seen by general surgery and IR, s/p cholecystostomy tube insertion done on 8/10, tolerated procedure well.  Diet was advanced and tolerated well.  Aspirin was resumed after general surgery clearance.  Patient was cleared by general surgery to discharge on oral antibiotics.  Patient was discharged on Augmentin twice daily for 10 days.  Follow-up with  general surgery as an outpatient in 1 week.  Follow-up with IR for cholecystostomy tube management in 6 to 8 weeks # Pyuria, Present on admission, UA was positive for large leukocytes. S/p Zosyn and patient was discharged on Augmentin, which will cover UTI.   # Acute hypoxic respiratory failure: CXR: Bibasilar atelectasis/scarring. Pneumonia at the left lung base is not excluded. Supplemental O2 relation has been weaned off, currently patient is saturating 91% at rest on room air. S/p Zosyn and discharged on Augmentin. # Depression: Home meds bupropion 150 mg every morning, trazodone 100 mg nightly, Abilify 10 mg daily were resumed on dc # GERD (gastroesophageal reflux disease) Protonix  # Hyperlipidemia: Resumed Lipitor 20 mg home dose. # Elevated troponin: Due to demand ischemia.  No MI, Cardiology consulted, recommended outpatient follow-up for ischemic workup. Echo within normal limits. Started aspirin, cleared by general surgery.  # Hypertension: Amlodipine and lisinopril had to be discontinued due to hypotension. S/p midodrine 10 mg p.o. 3 times daily given during hospital stay.  Blood pressure improved, midodrine was prescribed 5 mg p.o. 3 times daily as needed if SBP less than 100 mmHg.  Resume lisinopril 20 mg and amlodipine 10 mg p.o. daily if blood pressure remains high.  Patient was advised to monitor BP at home and follow with PCP for further management.  Body mass index is 29.3 kg/m.  Nutrition Interventions:  Pain control  - [place] Controlled Substance Reporting System database could not be reviewed, as website was not working. - 5 day supply was provided. - Patient was instructed, not to drive, operate heavy machinery, perform  activities at heights, swimming or participation in water activities or provide baby sitting services while on Pain, Sleep and Anxiety Medications; until her outpatient Physician has advised to do so again.  - Also recommended to not to take more than  prescribed Pain, Sleep and Anxiety Medications.  Patient was ambulatory without any assistance. On the day of the discharge the patient's vitals were stable, and no other acute medical condition were reported by patient. the patient was felt safe to be discharge at Home.  Consultants: General Surgery, IR Procedures: s/p percutaneous cholecystostomy tube insertion  Discharge Exam: General: Appear in no distress, no Rash; Oral Mucosa Clear, moist. Cardiovascular: S1 and S2 Present, no Murmur, Respiratory: normal respiratory effort, Bilateral Air entry present and no Crackles, no wheezes Abdomen: Bowel Sound present, Soft and no tenderness, no hernia Extremities: no Pedal edema, no calf tenderness Neurology: alert and oriented to time, place, and person affect appropriate.  Filed Weights   03/07/23 0948  Weight: 90 kg   Vitals:   03/11/23 0404 03/11/23 0714  BP: 123/69 122/70  Pulse: (!) 59 63  Resp: 16 18  Temp: 98.6 F (37 C) 98.8 F (37.1 C)  SpO2: 92% 91%    DISCHARGE MEDICATION: Allergies as of 03/11/2023   No Known Allergies      Medication List     STOP taking these medications    albuterol 108 (90 Base) MCG/ACT inhaler Commonly known as: VENTOLIN HFA   beclomethasone 80 MCG/ACT inhaler Commonly known as: QVAR   traMADol 50 MG tablet Commonly known as: ULTRAM       TAKE these medications    amLODipine 10 MG tablet Commonly known as: NORVASC Take 1 tablet (10 mg total) by mouth daily. Skip the dose if systolic BP <140 mmHg What changed: additional instructions   amoxicillin-clavulanate 875-125 MG tablet Commonly known as: AUGMENTIN Take 1 tablet by mouth every 12 (twelve) hours for 10 days.   ARIPiprazole 10 MG tablet Commonly known as: ABILIFY Take by mouth.   aspirin EC 81 MG tablet Take 1 tablet (81 mg total) by mouth daily. Swallow whole. Start taking on: March 12, 2023   atorvastatin 20 MG tablet Commonly known as: LIPITOR Take 1  tablet by mouth daily.   buPROPion 150 MG 24 hr tablet Commonly known as: WELLBUTRIN XL Take 1 tablet by mouth every morning.   cetirizine 10 MG tablet Commonly known as: ZYRTEC Take 10 mg by mouth daily.   dicyclomine 20 MG tablet Commonly known as: BENTYL Take 20 mg by mouth at bedtime as needed for spasms.   fluticasone 50 MCG/ACT nasal spray Commonly known as: FLONASE Place 1 spray into both nostrils daily as needed for allergies.   fluticasone-salmeterol 100-50 MCG/ACT Aepb Commonly known as: ADVAIR Inhale 1 puff into the lungs 2 (two) times daily.   gabapentin 300 MG capsule Commonly known as: NEURONTIN Take 300 mg by mouth 2 (two) times daily.   lisinopril 20 MG tablet Commonly known as: ZESTRIL Take 1 tablet (20 mg total) by mouth 2 (two) times daily. Skip the dose if systolic BP <130 mmHg What changed: additional instructions   midodrine 5 MG tablet Commonly known as: PROAMATINE Take 1 tablet (5 mg total) by mouth 3 (three) times daily as needed for up to 5 days (SBP <100).   oxyCODONE 5 MG immediate release tablet Commonly known as: Oxy IR/ROXICODONE Take 1 tablet (5 mg total) by mouth every 6 (six) hours as needed for up to 5  days for severe pain or moderate pain.   traZODone 100 MG tablet Commonly known as: DESYREL Take 100 mg by mouth at bedtime.   Vitamin D (Ergocalciferol) 1.25 MG (50000 UNIT) Caps capsule Commonly known as: DRISDOL Take 50,000 Units by mouth once a week.       No Known Allergies Discharge Instructions     Call MD for:  difficulty breathing, headache or visual disturbances   Complete by: As directed    Call MD for:  extreme fatigue   Complete by: As directed    Call MD for:  persistant dizziness or light-headedness   Complete by: As directed    Call MD for:  persistant nausea and vomiting   Complete by: As directed    Call MD for:  severe uncontrolled pain   Complete by: As directed    Call MD for:  temperature >100.4    Complete by: As directed    Diet - low sodium heart healthy   Complete by: As directed    Discharge instructions   Complete by: As directed    Follow-up with PCP in 1 week Follow-up with general surgery in 1 week Follow with IR in 6 to 8 weeks   Increase activity slowly   Complete by: As directed    No wound care   Complete by: As directed        The results of significant diagnostics from this hospitalization (including imaging, microbiology, ancillary and laboratory) are listed below for reference.    Significant Diagnostic Studies: IR Perc Cholecystostomy  Result Date: 03/10/2023 INDICATION: Acute cholecystitis EXAM: Placement of percutaneous cholecystostomy tube using ultrasound and fluoroscopic guidance MEDICATIONS: Documented in the EMR ANESTHESIA/SEDATION: Moderate (conscious) sedation was employed during this procedure. A total of Versed 1.5 mg and Fentanyl 75 mcg was administered intravenously by the radiology nurse. Total intra-service moderate Sedation Time: 20 minutes. The patient's level of consciousness and vital signs were monitored continuously by radiology nursing throughout the procedure under my direct supervision. FLUOROSCOPY: Radiation Exposure Index (as provided by the fluoroscopic device): 1.9 minutes (29 mGy) COMPLICATIONS: None immediate. PROCEDURE: Informed written consent was obtained from the patient after a thorough discussion of the procedural risks, benefits and alternatives. All questions were addressed. Maximal Sterile Barrier Technique was utilized including caps, mask, sterile gowns, sterile gloves, sterile drape, hand hygiene and skin antiseptic. A timeout was performed prior to the initiation of the procedure. The patient was placed supine on the exam table. The right upper quadrant was prepped and draped in the standard sterile fashion. Ultrasound of the right upper quadrant was performed for planning purposes. This again demonstrated a distended  gallbladder with wall edema and sludge, consistent with acute cholecystitis. An infracostal transhepatic approach was planned. Skin entry site was marked, and local analgesia was obtained with 1% lidocaine. Under ultrasound guidance, percutaneous access was obtained into the gallbladder via an infracostal transhepatic approach using a 21 gauge Chiba needle. Access was confirmed with visualization of needle tip within the gallbladder lumen, and free return of bile. An 018 Nitrex wire was then advanced through the access needle and coiled within the gallbladder lumen. A transition dilator was advanced over this wire, through which an antegrade cholecystogram was performed. Antegrade cholecystogram demonstrated appropriate location in the gallbladder lumen and gallbladder sludge. Over an Amplatz wire, the percutaneous tract was serially dilated followed by placement of a 10 French locking multipurpose drainage catheter into the gallbladder lumen. Locking loop was formed. Additional biliary sludge was drained.  Gentle hand injection of contrast material confirmed location of the gallbladder lumen. The drainage catheter was secured to the skin using silk suture and a dressing. It was placed to bag drainage. The patient tolerated the procedure well without immediate complication. IMPRESSION: Successful placement of a 10 French cholecystostomy tube. Cholecystostomy tube placed to bag drainage. Further follow-up recommendations per surgery. Electronically Signed   By: Olive Bass M.D.   On: 03/10/2023 11:40   DG Chest Port 1 View  Result Date: 03/09/2023 CLINICAL DATA:  Fever. EXAM: PORTABLE CHEST 1 VIEW COMPARISON:  Chest radiograph dated 03/07/2023. FINDINGS: There are bibasilar streaky atelectasis/scarring. Faint density at the left lung base noted. Pneumonia is not excluded. There is no pleural effusion pneumothorax. The cardiac silhouette is within normal limits. No acute osseous pathology. IMPRESSION: Bibasilar  atelectasis/scarring. Pneumonia at the left lung base is not excluded. Electronically Signed   By: Elgie Collard M.D.   On: 03/09/2023 17:31   ECHOCARDIOGRAM COMPLETE  Result Date: 03/08/2023    ECHOCARDIOGRAM REPORT   Patient Name:   RISHA LORENZEN Donate Date of Exam: 03/08/2023 Medical Rec #:  [id number]  Height:       69.0 in Accession #:    [id number] Weight:       198.4 lb Date of Birth:  05/15/1961  BSA:          2.059 m Patient Age:    62 years   BP:           157/80 mmHg Patient Gender: F          HR:           63 bpm. Exam Location:  [facility] Procedure: 2D Echo and Strain Analysis Indications:     Dyspnea R06.00                  Chest Pain R07.9  History:         Patient has no prior history of Echocardiogram examinations.  Sonographer:     Overton Mam RDCS, FASE Referring Phys:  [id number] AMY N Cromwell Diagnosing Phys: Jodelle Red MD  Sonographer Comments: Global longitudinal strain was attempted. IMPRESSIONS  1. Left ventricular ejection fraction, by estimation, is 55 to 60%. The left ventricle has normal function. The left ventricle has no regional wall motion abnormalities. There is moderate concentric left ventricular hypertrophy. Left ventricular diastolic parameters were normal.  2. Right ventricular systolic function is normal. The right ventricular size is normal. There is normal pulmonary artery systolic pressure.  3. The mitral valve is normal in structure. Trivial mitral valve regurgitation. No evidence of mitral stenosis.  4. The aortic valve is tricuspid. Aortic valve regurgitation is not visualized. No aortic stenosis is present.  5. The inferior vena cava is normal in size with greater than 50% respiratory variability, suggesting right atrial pressure of 3 mmHg. Comparison(s): No prior Echocardiogram. Conclusion(s)/Recommendation(s): Normal biventricular function without evidence of hemodynamically significant valvular heart disease. FINDINGS  Left Ventricle: Left ventricular ejection  fraction, by estimation, is 55 to 60%. The left ventricle has normal function. The left ventricle has no regional wall motion abnormalities. The global longitudinal strain is normal despite suboptimal segment tracking. The left ventricular internal cavity size was normal in size. There is moderate concentric left ventricular hypertrophy. Left ventricular diastolic parameters were normal. Right Ventricle: The right ventricular size is normal. Right vetricular wall thickness was not well visualized. Right ventricular systolic function is normal. There is normal pulmonary artery systolic pressure. The tricuspid  regurgitant velocity is 2.42 m/s, and with an assumed right atrial pressure of 3 mmHg, the estimated right ventricular systolic pressure is 26.4 mmHg. Left Atrium: Left atrial size was normal in size. Right Atrium: Right atrial size was normal in size. Pericardium: There is no evidence of pericardial effusion. Mitral Valve: The mitral valve is normal in structure. Trivial mitral valve regurgitation. No evidence of mitral valve stenosis. Tricuspid Valve: The tricuspid valve is normal in structure. Tricuspid valve regurgitation is trivial. No evidence of tricuspid stenosis. Aortic Valve: The aortic valve is tricuspid. Aortic valve regurgitation is not visualized. No aortic stenosis is present. Aortic valve peak gradient measures 11.3 mmHg. Pulmonic Valve: The pulmonic valve was not well visualized. Pulmonic valve regurgitation is not visualized. No evidence of pulmonic stenosis. Aorta: The aortic root, ascending aorta and aortic arch are all structurally normal, with no evidence of dilitation or obstruction. Venous: The inferior vena cava is normal in size with greater than 50% respiratory variability, suggesting right atrial pressure of 3 mmHg. IAS/Shunts: The atrial septum is grossly normal.  LEFT VENTRICLE PLAX 2D LVIDd:         4.70 cm   Diastology LVIDs:         3.30 cm   LV e' medial:    6.20 cm/s LV PW:          1.30 cm   LV E/e' medial:  8.4 LV IVS:        1.40 cm   LV e' lateral:   7.18 cm/s LVOT diam:     2.00 cm   LV E/e' lateral: 7.2 LV SV:         67 LV SV Index:   32 LVOT Area:     3.14 cm  RIGHT VENTRICLE RV Basal diam:  3.30 cm RV S prime:     12.40 cm/s TAPSE (M-mode): 1.6 cm LEFT ATRIUM             Index        RIGHT ATRIUM           Index LA diam:        4.20 cm 2.04 cm/m   RA Area:     15.20 cm LA Vol (A2C):   42.0 ml 20.40 ml/m  RA Volume:   38.80 ml  18.84 ml/m LA Vol (A4C):   34.9 ml 16.95 ml/m LA Biplane Vol: 38.4 ml 18.65 ml/m  AORTIC VALVE                 PULMONIC VALVE AV Area (Vmax): 2.24 cm     PV Vmax:        1.04 m/s AV Vmax:        168.00 cm/s  PV Peak grad:   4.3 mmHg AV Peak Grad:   11.3 mmHg    RVOT Peak grad: 3 mmHg LVOT Vmax:      120.00 cm/s LVOT Vmean:     76.600 cm/s LVOT VTI:       0.213 m  AORTA Ao Root diam: 3.20 cm Ao Asc diam:  3.20 cm MITRAL VALVE               TRICUSPID VALVE MV Area (PHT): 2.58 cm    TR Peak grad:   23.4 mmHg MV Decel Time: 294 msec    TR Vmax:        242.00 cm/s MV E velocity: 51.90 cm/s MV A velocity: 72.80 cm/s  SHUNTS MV E/A ratio:  0.71  Systemic VTI:  0.21 m                            Systemic Diam: 2.00 cm Jodelle Red MD Electronically signed by Jodelle Red MD Signature Date/Time: 03/08/2023/10:31:33 AM    Final    US Abdomen Limited RUQ (LIVER/GB)  Result Date: 03/08/2023 CLINICAL DATA:  Acute cholecystitis. EXAM: ULTRASOUND ABDOMEN LIMITED RIGHT UPPER QUADRANT COMPARISON:  CT 03/07/2023 FINDINGS: Gallbladder: Positive sonographic Murphy's sign. Gallbladder wall thickening. Multiple gallstones with dense posterior shadowing. A portion of the gallbladder wall appears sloughed into the lumen similar to CT. Common bile duct: Diameter: Normal at 4 mm Liver: Increased liver echogenicity no biliary duct dilatation portal vein is patent on color Doppler imaging with normal direction of blood flow towards the liver. Other:  None. IMPRESSION: Ultrasound findings consistent acute cholecystitis. Electronically Signed   By: Genevive Bi M.D.   On: 03/08/2023 09:38   CT Angio Chest PE W and/or Wo Contrast  Result Date: 03/07/2023 CLINICAL DATA:  Right chest pain, hypoxia EXAM: CT ANGIOGRAPHY CHEST WITH CONTRAST TECHNIQUE: Multidetector CT imaging of the chest was performed using the standard protocol during bolus administration of intravenous contrast. Multiplanar CT image reconstructions and MIPs were obtained to evaluate the vascular anatomy. RADIATION DOSE REDUCTION: This exam was performed according to the departmental dose-optimization program which includes automated exposure control, adjustment of the mA and/or kV according to patient size and/or use of iterative reconstruction technique. CONTRAST:  80mL OMNIPAQUE IOHEXOL 350 MG/ML SOLN COMPARISON:  Chest radiograph done today FINDINGS: Cardiovascular: There are no intraluminal filling defects in pulmonary artery branches. There is homogeneous enhancement in thoracic aorta. Scattered calcifications are seen in thoracic aorta. Small scattered coronary artery calcifications are seen. Heart is enlarged in size. Mediastinum/Nodes: No significant lymphadenopathy is seen. There are no inflammatory changes adjacent to the thoracic esophagus. Lungs/Pleura: There are small linear patchy infiltrates in both lower lung fields. Rest of the lung fields are clear. There is no significant pleural effusion. There is no pneumothorax. Upper Abdomen: Please refer to the report for CT abdomen and pelvis. Musculoskeletal: No acute findings are seen. Review of the MIP images confirms the above findings. IMPRESSION: There is no evidence of pulmonary embolism. There is no evidence of thoracic aortic dissection. Aortic arteriosclerosis. Small calcifications are seen in coronary artery branches. There are small linear patchy infiltrates in both lower lung fields suggesting atelectasis/pneumonia.  Electronically Signed   By: Ernie Avena M.D.   On: 03/07/2023 12:47   CT ABDOMEN PELVIS W CONTRAST  Result Date: 03/07/2023 CLINICAL DATA:  Acute right-sided abdominal pain, vomiting EXAM: CT ABDOMEN AND PELVIS WITH CONTRAST TECHNIQUE: Multidetector CT imaging of the abdomen and pelvis was performed using the standard protocol following bolus administration of intravenous contrast. RADIATION DOSE REDUCTION: This exam was performed according to the departmental dose-optimization program which includes automated exposure control, adjustment of the mA and/or kV according to patient size and/or use of iterative reconstruction technique. CONTRAST:  80mL OMNIPAQUE IOHEXOL 350 MG/ML SOLN COMPARISON:  None Available. FINDINGS: Lower chest: Heart is enlarged in size. There are small patchy infiltrates in both lower lung fields. Left hemidiaphragm is elevated. Hepatobiliary: There is 11 mm fluid density structure in the right lobe suggesting hepatic cyst. There is no dilation of bile ducts. Gallbladder is distended. There is fluid around the gallbladder. There are high density foci in the lumen of the gallbladder suggesting gallbladder stones. Pancreas: No focal  abnormalities are seen. Spleen: Unremarkable. Adrenals/Urinary Tract: Adrenals are unremarkable. There is no hydronephrosis. There are no renal or ureteral stones. There are few subcentimeter low-density foci in the kidneys suggesting possible renal cysts. Urinary bladder is not distended. Stomach/Bowel: Stomach is unremarkable. Small bowel loops are not dilated. The appendix is not distinctly visualized. In coronal image 46, there is a small caliber tubular structure in the margin of the cecum, possibly normal appendix. There is no pericecal inflammation. There is no significant wall thickening in colon. Scattered diverticula are seen without signs of focal diverticulitis. Vascular/Lymphatic: Scattered arterial calcifications are seen. Reproductive: Uterus  is not seen. Other: There is no ascites or pneumoperitoneum. Umbilical/paraumbilical hernia containing fat is seen. Musculoskeletal: Degenerative changes are noted in lumbar spine, more so at L4-L5 and L5-S1 levels with significant encroachment of neural foramina. IMPRESSION: Gallbladder is distended with pericholecystic fluid. Gallbladder stones are seen. Findings suggest acute cholecystitis. Please correlate with clinical and laboratory findings and consider gallbladder sonogram. There is no dilation of bile ducts. Small patchy infiltrates are seen in both lower lung fields suggesting subsegmental atelectasis. There is no evidence of intestinal obstruction or pneumoperitoneum. There is no hydronephrosis. Cysts seen in the liver and both kidneys. Diverticulosis of colon without signs of focal diverticulitis. Aortic arteriosclerosis. Lumbar spondylosis. Electronically Signed   By: Ernie Avena M.D.   On: 03/07/2023 12:43   DG Chest 1 View  Result Date: 03/07/2023 CLINICAL DATA:  Shortness of breath.  Chest pain EXAM: CHEST  1 VIEW COMPARISON:  Chest radiographs 06/20/2015 FINDINGS: Cardiac silhouette is at the upper limits of normal size, similar to prior. Mediastinal contours are within normal limits. Mild bilateral lower lung linear likely subsegmental atelectasis. No focal airspace opacity to indicate pneumonia. No pulmonary edema, pleural effusion, or pneumothorax. No acute skeletal abnormality. IMPRESSION: Mild bilateral lower lung linear likely subsegmental atelectasis. Electronically Signed   By: Neita Garnet M.D.   On: 03/07/2023 11:26    Microbiology: Recent Results (from the past 240 hour(s))  Resp panel by RT-PCR (RSV, Flu A&B, Covid) Anterior Nasal Swab     Status: None   Collection Time: 03/07/23  1:14 PM   Specimen: Anterior Nasal Swab  Result Value Ref Range Status   SARS Coronavirus 2 by RT PCR NEGATIVE NEGATIVE Final    Comment: (NOTE) SARS-CoV-2 target nucleic acids are NOT  DETECTED.  The SARS-CoV-2 RNA is generally detectable in upper respiratory specimens during the acute phase of infection. The lowest concentration of SARS-CoV-2 viral copies this assay can detect is 138 copies/mL. A negative result does not preclude SARS-Cov-2 infection and should not be used as the sole basis for treatment or other patient management decisions. A negative result may occur with  improper specimen collection/handling, submission of specimen other than nasopharyngeal swab, presence of viral mutation(s) within the areas targeted by this assay, and inadequate number of viral copies(<138 copies/mL). A negative result must be combined with clinical observations, patient history, and epidemiological information. The expected result is Negative.  Fact Sheet for Patients:  BloggerCourse.com  Fact Sheet for Healthcare Providers:  SeriousBroker.it  This test is no t yet approved or cleared by the [place] FDA and  has been authorized for detection and/or diagnosis of SARS-CoV-2 by FDA under an Emergency Use Authorization (EUA). This EUA will remain  in effect (meaning this test can be used) for the duration of the COVID-19 declaration under Section 564(b)(1) of the Act, 21 U.S.C.section 360bbb-3(b)(1), unless the authorization is terminated  or revoked sooner.       Influenza A by PCR NEGATIVE NEGATIVE Final   Influenza B by PCR NEGATIVE NEGATIVE Final    Comment: (NOTE) The Xpert Xpress SARS-CoV-2/FLU/RSV plus assay is intended as an aid in the diagnosis of influenza from Nasopharyngeal swab specimens and should not be used as a sole basis for treatment. Nasal washings and aspirates are unacceptable for Xpert Xpress SARS-CoV-2/FLU/RSV testing.  Fact Sheet for Patients: BloggerCourse.com  Fact Sheet for Healthcare Providers: SeriousBroker.it  This test is not yet  approved or cleared by the [place] FDA and has been authorized for detection and/or diagnosis of SARS-CoV-2 by FDA under an Emergency Use Authorization (EUA). This EUA will remain in effect (meaning this test can be used) for the duration of the COVID-19 declaration under Section 564(b)(1) of the Act, 21 U.S.C. section 360bbb-3(b)(1), unless the authorization is terminated or revoked.     Resp Syncytial Virus by PCR NEGATIVE NEGATIVE Final    Comment: (NOTE) Fact Sheet for Patients: BloggerCourse.com  Fact Sheet for Healthcare Providers: SeriousBroker.it  This test is not yet approved or cleared by the [place] FDA and has been authorized for detection and/or diagnosis of SARS-CoV-2 by FDA under an Emergency Use Authorization (EUA). This EUA will remain in effect (meaning this test can be used) for the duration of the COVID-19 declaration under Section 564(b)(1) of the Act, 21 U.S.C. section 360bbb-3(b)(1), unless the authorization is terminated or revoked.  Performed at [facility], [address] [place], [place] [address]   Blood Culture (routine x 2)     Status: None (Preliminary result)   Collection Time: 03/07/23  1:19 PM   Specimen: BLOOD  Result Value Ref Range Status   Specimen Description BLOOD LEFT ANTECUBITAL  Final   Special Requests   Final    BOTTLES DRAWN AEROBIC AND ANAEROBIC Blood Culture adequate volume   Culture   Final    NO GROWTH 4 DAYS Performed at [facility], [address] [place], [place] [address]    Report Status PENDING  Incomplete  Blood Culture (routine x 2)     Status: None (Preliminary result)   Collection Time: 03/07/23  1:46 PM   Specimen: BLOOD  Result Value Ref Range Status   Specimen Description BLOOD RIGHT ANTECUBITAL  Final   Special Requests   Final    BOTTLES DRAWN AEROBIC AND ANAEROBIC Blood Culture adequate volume   Culture   Final    NO  GROWTH 4 DAYS Performed at [facility], [address] [place], [place] [address]    Report Status PENDING  Incomplete     Labs: CBC: Recent Labs  Lab 03/07/23 1000 03/08/23 0431 03/08/23 1931 03/09/23 0402 03/10/23 0346 03/11/23 0459  WBC 14.4* 14.2* 11.1* 11.2* 7.4 5.6  NEUTROABS 12.5*  --  9.2*  --   --   --   HGB 16.2* 15.7* 14.2 14.8 13.4 13.3  HCT 48.7* 45.4 44.2 45.4 40.6 39.2  MCV 95.5 93.2 97.4 97.4 95.8 93.6  PLT 221 214 183 185 170 176   Basic Metabolic Panel: Recent Labs  Lab 03/08/23 0431 03/08/23 1931 03/09/23 0402 03/10/23 0346 03/11/23 0459  NA 135 136 139 140 141  K 3.5 3.6 3.4* 3.7 3.3*  CL 100 100 103 105 106  CO2 26 29 29 29 27   GLUCOSE 140* 146* 101* 88 94  BUN 10 13 14 14 18   CREATININE 0.62 1.14* 0.87 0.80 0.68  CALCIUM  8.5* 8.0* 8.0* 8.4* 8.6*  MG  --   --   --  2.5* 2.0  PHOS  --   --   --  2.8 3.6   Liver Function Tests: Recent Labs  Lab 03/07/23 1000 03/08/23 1931  AST 19 16  ALT 18 15  ALKPHOS 59 48  BILITOT 0.9 0.9  PROT 7.4 6.1*  ALBUMIN 4.1 3.4*   Recent Labs  Lab 03/07/23 1000  LIPASE 28   No results for input(s): "AMMONIA" in the last 168 hours. Cardiac Enzymes: No results for input(s): "CKTOTAL", "CKMB", "CKMBINDEX", "TROPONINI" in the last 168 hours. BNP (last 3 results) No results for input(s): "BNP" in the last 8760 hours. CBG: No results for input(s): "GLUCAP" in the last 168 hours.  Time spent: 35 minutes  Signed:  Gillis Santa  [facility] 03/11/2023 2:04 PM

## 2023-03-11 NOTE — Plan of Care (Signed)

## 2023-03-11 NOTE — Plan of Care (Signed)
  Problem: Education: Goal: Knowledge of General Education information will improve Description: Including pain rating scale, medication(s)/side effects and non-pharmacologic comfort measures Outcome: Adequate for Discharge   Problem: Health Behavior/Discharge Planning: Goal: Ability to manage health-related needs will improve Outcome: Adequate for Discharge   Problem: Clinical Measurements: Goal: Ability to maintain clinical measurements within normal limits will improve Outcome: Adequate for Discharge Goal: Will remain free from infection Outcome: Adequate for Discharge Goal: Diagnostic test results will improve Outcome: Adequate for Discharge Goal: Respiratory complications will improve Outcome: Adequate for Discharge Goal: Cardiovascular complication will be avoided Outcome: Adequate for Discharge   Problem: Activity: Goal: Risk for activity intolerance will decrease Outcome: Adequate for Discharge   Problem: Nutrition: Goal: Adequate nutrition will be maintained Outcome: Adequate for Discharge   Problem: Coping: Goal: Level of anxiety will decrease Outcome: Adequate for Discharge   Problem: Elimination: Goal: Will not experience complications related to bowel motility Outcome: Adequate for Discharge Goal: Will not experience complications related to urinary retention Outcome: Adequate for Discharge   Problem: Pain Managment: Goal: General experience of comfort will improve Outcome: Adequate for Discharge   Problem: Safety: Goal: Ability to remain free from injury will improve Outcome: Adequate for Discharge   Problem: Skin Integrity: Goal: Risk for impaired skin integrity will decrease Outcome: Adequate for Discharge    Pt aox4, respirations even and unlabored on RA. Pt has all belongings. Pt has received all DC paperwork. Pt being taken home by daughter. Pt being taken down to lobby with staff via wheelchair.

## 2023-03-12 ENCOUNTER — Ambulatory Visit: Payer: Self-pay

## 2023-03-12 NOTE — Telephone Encounter (Signed)
Patient wanted to know if she should be able to see the white tube coming from her body. Patient has a JP drain in place and was advised that she should be able to see a white tube connected to a clear tube. Patient confirmed that she could see both. Patient also asked what is a PCP. Advised patient that a PCP is a primary care provider and she will need to schedule a hospital follow-up with her provider. Patient stated she does have a PCP and will call to schedule her appointment. Answer Assessment - Initial Assessment Questions 1. REASON FOR CALL or QUESTION: "What is your reason for calling today?" or "How can I best help you?" or "What question do you have that I can help answer?"     I can see a white tube coming out of my side is that normal and I need to follow-up with a PCP what is that?  Protocols used: Information Only Call - No Triage-A-AH

## 2023-03-18 ENCOUNTER — Ambulatory Visit: Payer: BLUE CROSS/BLUE SHIELD | Attending: Nurse Practitioner | Admitting: Nurse Practitioner

## 2023-03-18 ENCOUNTER — Encounter: Payer: Self-pay | Admitting: Nurse Practitioner

## 2023-03-18 VITALS — BP 115/78 | HR 87 | Ht 66.0 in | Wt 194.4 lb

## 2023-03-18 DIAGNOSIS — I2489 Other forms of acute ischemic heart disease: Secondary | ICD-10-CM

## 2023-03-18 DIAGNOSIS — E785 Hyperlipidemia, unspecified: Secondary | ICD-10-CM

## 2023-03-18 DIAGNOSIS — I10 Essential (primary) hypertension: Secondary | ICD-10-CM | POA: Diagnosis not present

## 2023-03-18 DIAGNOSIS — R42 Dizziness and giddiness: Secondary | ICD-10-CM | POA: Diagnosis not present

## 2023-03-18 DIAGNOSIS — K819 Cholecystitis, unspecified: Secondary | ICD-10-CM

## 2023-03-18 NOTE — Progress Notes (Signed)
Office Visit    Patient Name: Denise Huffman Date of Encounter: 03/18/2023  Primary Care Provider:  [facility] Primary Primary Cardiologist:  None - seen by B. Cristal Deer, MD during hosp stay - plans to f/u w/ [facility]  Chief Complaint    62 y.o. female with a history of GERD, hypertension, hyperlipidemia, vertigo, depression, who presents for follow-up after recent hospitalization for cholecystitis and incidental finding of demand ischemia.  Past Medical History    Past Medical History:  Diagnosis Date   Anxiety    Asthma    Cholecystitis    a. 02/2023 s/p perc drain.   Demand ischemia    a. 02/2023 HsTrop to 404 in setting of admission for cholecystitis; b. 02/2023 Echo: EF 55-60%, no rwma, mod LVH, nl RV fxn, nl RVSP, triv MR.   Depression    GERD (gastroesophageal reflux disease)    Hyperlipidemia    Hypertension    Insomnia    Vertigo    Past Surgical History:  Procedure Laterality Date   ABDOMINAL HYSTERECTOMY     BACK SURGERY     IR PERC CHOLECYSTOSTOMY  03/08/2023    Allergies  No Known Allergies  History of Present Illness      62 y.o. y/o female with a history of GERD, hypertension, hyperlipidemia, vertigo, and depression.  She has chronic, stable dyspnea exertion, which attributes to being overweight and active.  She was hospitalized at [facility] in early August the setting of right upper quadrant pain associated with nausea and vomiting.  She was hypertensive on arrival at 170/104 with a low-grade fever of 99.1.  Troponin rose to 104.  ECG was without acute changes.  Chest x-ray showed atelectasis while CTA of the chest was negative for PE/dissection but did show coronary calcifications.  CT of the abdomen/pelvis showed distended gallbladder with pericholecystic fluid and stone suggestive of acute cholecystitis.  Echocardiogram showed normal LV and RV function.  She was seen by surgery and underwent percutaneous drain.  Postoperative course was complicated by  fever and hypotension requiring holding of home medications.  Aspirin and statin therapy were initiated with plan for outpatient follow-up and possible coronary CT angiogram.   Following discharge from [facility], patient was seen at the [facility] emergency department on August 15 secondary to hypertension-174/93.  She also complained of headache.  ED workup was unremarkable.  She was treated with Toradol and advised to continue home antihypertensive regimen.  Since then, she has felt well.  Her blood pressure has been reasonably controlled at home.  She has noticed a decline in drainage from her percutaneous drain.  She has chronic, stable dyspnea on exertion and does not experience chest pain.  Further, she denies palpitations, PND, orthopnea, dizziness, syncope, edema, or early satiety.  She continues to have vertiginous symptoms, which is a longstanding diagnosis and for which, she takes meclizine as needed.  She has never been seen by physical therapy for vestibular training.  Patient notes that [facility] is out of network for her and therefore, she plans to establish with [facility] cardiology for further evaluation of demand ischemia.  Home Medications    Current Outpatient Medications  Medication Sig Dispense Refill   amLODipine (NORVASC) 10 MG tablet Take 1 tablet (10 mg total) by mouth daily. Skip the dose if systolic BP <140 mmHg     amoxicillin-clavulanate (AUGMENTIN) 875-125 MG tablet Take 1 tablet by mouth every 12 (twelve) hours for 10 days. 20 tablet 0  ARIPiprazole (ABILIFY) 10 MG tablet Take by mouth.     aspirin EC 81 MG tablet Take 1 tablet (81 mg total) by mouth daily. Swallow whole. 30 tablet 12   atorvastatin (LIPITOR) 20 MG tablet Take 1 tablet by mouth daily.     buPROPion (WELLBUTRIN XL) 150 MG 24 hr tablet Take 1 tablet by mouth every morning.     cetirizine (ZYRTEC) 10 MG tablet Take 10 mg by mouth daily.     dicyclomine (BENTYL) 20 MG tablet Take 20 mg by mouth at bedtime as  needed for spasms.     fluticasone (FLONASE) 50 MCG/ACT nasal spray Place 1 spray into both nostrils daily as needed for allergies.     fluticasone-salmeterol (ADVAIR) 100-50 MCG/ACT AEPB Inhale 1 puff into the lungs 2 (two) times daily.     gabapentin (NEURONTIN) 300 MG capsule Take 300 mg by mouth 2 (two) times daily.     lisinopril (ZESTRIL) 20 MG tablet Take 1 tablet (20 mg total) by mouth 2 (two) times daily. Skip the dose if systolic BP <130 mmHg     traZODone (DESYREL) 100 MG tablet Take 100 mg by mouth at bedtime.     Vitamin D, Ergocalciferol, (DRISDOL) 1.25 MG (50000 UNIT) CAPS capsule Take 50,000 Units by mouth once a week.     No current facility-administered medications for this visit.     Review of Systems    Some degree of chronic dyspnea exertion.  She denies chest pain, palpitations, PND, orthopnea, dizziness, syncope, edema, or early satiety.  Still having some the reduced drainage from her gallbladder percutaneous drain.  No abdominal discomfort.  All other systems reviewed and are otherwise negative except as noted above.    Physical Exam    VS:  BP 115/78 (BP Location: Left Arm, Patient Position: Sitting, Cuff Size: Large)   Pulse 87   Ht 5\' 6"  (1.676 m)   Wt 194 lb 6 oz (88.2 kg)   SpO2 97%   BMI 31.37 kg/m  , BMI Body mass index is 31.37 kg/m.     GEN: Well nourished, well developed, in no acute distress. HEENT: normal. Neck: Supple, no JVD, carotid bruits, or masses. Cardiac: RRR, no murmurs, rubs, or gallops. No clubbing, cyanosis, edema.  Radials 2+/PT 2+ and equal bilaterally.  Respiratory:  Respirations regular and unlabored, clear to auscultation bilaterally. GI: Soft, nontender, nondistended, BS + x 4.  Right upper quadrant drain with serosanguineous drainage in bag. MS: no deformity or atrophy. Skin: warm and dry, no rash. Neuro:  Strength and sensation are intact. Psych: Normal affect.  Accessory Clinical Findings    ECG personally reviewed by  me today - EKG Interpretation Date/Time:  Tuesday March 18 2023 10:01:17 EDT Ventricular Rate:  87 PR Interval:  144 QRS Duration:  70 QT Interval:  402 QTC Calculation: 483 R Axis:   8  Text Interpretation: Normal sinus rhythm Cannot rule out Inferior infarct , age undetermined When compared with ECG of 07-Mar-2023 11:10, PREVIOUS ECG IS PRESENT Confirmed by Nicolasa Ducking [phone]) on 03/18/2023 10:12:19 AM  - no acute changes.  Lab Results  Component Value Date   WBC 5.6 03/11/2023   HGB 13.3 03/11/2023   HCT 39.2 03/11/2023   MCV 93.6 03/11/2023   PLT 176 03/11/2023   Lab Results  Component Value Date   CREATININE 0.68 03/11/2023   BUN 18 03/11/2023   NA 141 03/11/2023   K 3.3 (L) 03/11/2023   CL 106 03/11/2023  CO2 27 03/11/2023   Lab Results  Component Value Date   ALT 15 03/08/2023   AST 16 03/08/2023   ALKPHOS 48 03/08/2023   BILITOT 0.9 03/08/2023   Assessment & Plan    1.  Demand ischemia: Patient recently hospitalized with acute cholecystitis and was noted to have elevated troponin to 404.  Echo showed normal LV and RV function without significant valvular disease.  She was seen by our team during hospitalization and subsequently underwent percutaneous drain.  She was placed on aspirin and statin therapy at discharge with recommendation for outpatient ischemic evaluation.  Unfortunately, she has mostly felt well since hospital discharge and has not chest pain.  Follow resumption of home medications, blood pressures have been better.  We discussed pursuit of a coronary CT angiogram however, [facility] is out of network for Ms. Cuthbert' insurance, and therefore, at her request, we have placed a referral for [facility] cardiology in [place].  2.  Primary hypertension: She was initially hypotensive following her surgery was discharged home off of all medications though these were subsequently resumed in the setting of hypertension which prompted an ED visit at [facility].  Pressure  stable today at 115/78.  Continue current doses of lisinopril and amlodipine.  3.  Hyperlipidemia: Continue atorvastatin 20 mg.  4.  Cholecystitis: Status post percutaneous drain.  Overall feels well.  Will likely be following up with [facility] as [facility] is out of network.  5.  Vertigo: Longstanding issue, which she manages with meclizine.  She has never tried physical therapy for vestibular training.  I offered to make referral however, she wants to ensure that PT would be in network and therefore, she plans to discuss with primary care.  6.  Disposition: Referral placed for [facility] cardiology in [place].  She will follow-up here as needed.  Nicolasa Ducking, NP 03/18/2023, 12:30 PM

## 2023-03-18 NOTE — Patient Instructions (Signed)
Medication Instructions:  Your Physician recommend you continue on your current medication as directed.    *If you need a refill on your cardiac medications before your next appointment, please call your pharmacy*   Lab Work: None If you have labs (blood work) drawn today and your tests are completely normal, you will receive your results only by: MyChart Message (if you have MyChart) OR A paper copy in the mail If you have any lab test that is abnormal or we need to change your treatment, we will call you to review the results.   Testing/Procedures: None  Follow-Up: At [facility], you and your health needs are our priority.  As part of our continuing mission to provide you with exceptional heart care, we have created designated Provider Care Teams.  These Care Teams include your primary Cardiologist (physician) and Advanced Practice Providers (APPs -  Physician Assistants and Nurse Practitioners) who all work together to provide you with the care you need, when you need it.  We recommend signing up for the patient portal called "MyChart".  Sign up information is provided on this After Visit Summary.  MyChart is used to connect with patients for Virtual Visits (Telemedicine).  Patients are able to view lab/test results, encounter notes, upcoming appointments, etc.  Non-urgent messages can be sent to your provider as well.   To learn more about what you can do with MyChart, go to [email or web address].    Your next appointment:   As needed  Other Instructions A referral has been made to [facility] Cardiology [place] - call this number to set-up appt if you are not contacted: [phone]

## 2023-03-21 ENCOUNTER — Ambulatory Visit: Payer: Self-pay | Admitting: Surgery

## 2023-03-28 ENCOUNTER — Ambulatory Visit
Admission: EM | Admit: 2023-03-28 | Discharge: 2023-03-28 | Disposition: A | Discharge status: Home / Self Care | Payer: BLUE CROSS/BLUE SHIELD | Attending: Family Medicine | Admitting: Family Medicine

## 2023-03-28 ENCOUNTER — Encounter: Payer: Self-pay | Admitting: Emergency Medicine

## 2023-03-28 DIAGNOSIS — R1011 Right upper quadrant pain: Secondary | ICD-10-CM | POA: Diagnosis not present

## 2023-03-28 LAB — CBC WITH DIFFERENTIAL/PLATELET
Abs Immature Granulocytes: 0.06 10*3/uL (ref 0.00–0.07)
Basophils Absolute: 0.1 10*3/uL (ref 0.0–0.1)
Basophils Relative: 1 %
Eosinophils Absolute: 0.2 10*3/uL (ref 0.0–0.5)
Eosinophils Relative: 2 %
HCT: 42.8 % (ref 36.0–46.0)
Hemoglobin: 14.3 g/dL (ref 12.0–15.0)
Immature Granulocytes: 1 %
Lymphocytes Relative: 17 %
Lymphs Abs: 1.7 10*3/uL (ref 0.7–4.0)
MCH: 31.4 pg (ref 26.0–34.0)
MCHC: 33.4 g/dL (ref 30.0–36.0)
MCV: 94.1 fL (ref 80.0–100.0)
Monocytes Absolute: 0.5 10*3/uL (ref 0.1–1.0)
Monocytes Relative: 5 %
Neutro Abs: 7.4 10*3/uL (ref 1.7–7.7)
Neutrophils Relative %: 74 %
Platelets: 275 10*3/uL (ref 150–400)
RBC: 4.55 MIL/uL (ref 3.87–5.11)
RDW: 12.4 % (ref 11.5–15.5)
WBC: 9.9 10*3/uL (ref 4.0–10.5)
nRBC: 0 % (ref 0.0–0.2)

## 2023-03-28 NOTE — Discharge Instructions (Signed)
Your blood work looks good.  No signs of infection.  Most of your symptoms are due to the exertion related to work.  Activity as tolerated.  Continue with your plan to see his surgeon on Wednesday next week.

## 2023-03-28 NOTE — ED Provider Notes (Signed)
[facility]    CSN: [id number] Arrival date & time: 03/28/23  0808      History   Chief Complaint Chief Complaint  Patient presents with   Abdominal Pain    Mid right    HPI Denise Huffman is a 62 y.o. female.   Patient recently had gallbladder procedure where a drain was placed.  Drain was placed 2 weeks ago.  Drain was producing green-yellow bile like drainage consistently.  However for last 2 to 3 days patient is noticing less and less drainage.  She is also noticing some discomfort in the right upper quadrant.  She is supposed to see her surgeon back in 5 days from now.  She just wants to make sure there is no infection or anything to be concerned about.  According to her the drain is in the place.  It was checked by PCP last week as well.  Denies any fever chills nausea vomiting diarrhea.  She resumed her work where she has to sit and stand for 6 to 8 hours chest 4 days ago.  Most of her symptoms started after that.  Pain is localized discomfort without any radiation.  Achy kind of pain.'s 5 out of 10 in severity when worst.  2 out of 10 when its least painful.   Abdominal Pain   Past Medical History:  Diagnosis Date   Anxiety    Asthma    Cholecystitis    a. 02/2023 s/p perc drain.   Demand ischemia    a. 02/2023 HsTrop to 404 in setting of admission for cholecystitis; b. 02/2023 Echo: EF 55-60%, no rwma, mod LVH, nl RV fxn, nl RVSP, triv MR.   Depression    GERD (gastroesophageal reflux disease)    Hyperlipidemia    Hypertension    Insomnia    Vertigo     Patient Active Problem List   Diagnosis Date Noted   Acute cholecystitis 03/07/2023   Elevated troponin 03/07/2023   Hyperlipidemia 03/07/2023   Leukocytosis 03/07/2023   GERD (gastroesophageal reflux disease) 03/07/2023   Depression 03/07/2023   Pyuria 03/07/2023   Hypertension 04/17/2018   Pain in both lower extremities 04/01/2018   Bilateral lower extremity edema 04/01/2018    Past Surgical  History:  Procedure Laterality Date   ABDOMINAL HYSTERECTOMY     BACK SURGERY     IR PERC CHOLECYSTOSTOMY  03/08/2023    OB History   No obstetric history on file.      Home Medications    Prior to Admission medications   Medication Sig Start Date End Date Taking? Authorizing Provider  amLODipine (NORVASC) 10 MG tablet Take 1 tablet (10 mg total) by mouth daily. Skip the dose if systolic BP <140 mmHg 03/11/23   Gillis Santa, MD  ARIPiprazole (ABILIFY) 10 MG tablet Take by mouth. 02/06/23   [provider]  aspirin EC 81 MG tablet Take 1 tablet (81 mg total) by mouth daily. Swallow whole. 03/12/23   Gillis Santa, MD  atorvastatin (LIPITOR) 20 MG tablet Take 1 tablet by mouth daily. 05/14/22 05/14/23  [provider]  buPROPion (WELLBUTRIN XL) 150 MG 24 hr tablet Take 1 tablet by mouth every morning. 09/27/20 10/07/23  [provider]  cetirizine (ZYRTEC) 10 MG tablet Take 10 mg by mouth daily.    [provider]  dicyclomine (BENTYL) 20 MG tablet Take 20 mg by mouth at bedtime as needed for spasms.    [provider]  fluticasone (FLONASE) 50  MCG/ACT nasal spray Place 1 spray into both nostrils daily as needed for allergies. 08/27/22 08/27/23  [provider]  fluticasone-salmeterol (ADVAIR) 100-50 MCG/ACT AEPB Inhale 1 puff into the lungs 2 (two) times daily. 02/06/22   [provider]  gabapentin (NEURONTIN) 300 MG capsule Take 300 mg by mouth 2 (two) times daily.    [provider]  lisinopril (ZESTRIL) 20 MG tablet Take 1 tablet (20 mg total) by mouth 2 (two) times daily. Skip the dose if systolic BP <130 mmHg 03/11/23   Gillis Santa, MD  traZODone (DESYREL) 100 MG tablet Take 100 mg by mouth at bedtime.    [provider]  Vitamin D, Ergocalciferol, (DRISDOL) 1.25 MG (50000 UNIT) CAPS capsule Take 50,000 Units by mouth once a week. 01/27/23   [provider]  ipratropium (ATROVENT) 0.06 % nasal spray  Place 2 sprays into both nostrils 4 (four) times daily.  12/11/19  [provider]    Family History Family History  Problem Relation Age of Onset   Urinary tract infection Mother     Social History Social History   Tobacco Use   Smoking status: Never   Smokeless tobacco: Never  Vaping Use   Vaping status: Never Used  Substance Use Topics   Alcohol use: No   Drug use: No     Allergies   Patient has no known allergies.   Review of Systems Negative other than mentioned in HPI.   Physical Exam Triage Vital Signs ED Triage Vitals  Encounter Vitals Group     BP 03/28/23 0828 135/79     Systolic BP Percentile --      Diastolic BP Percentile --      Pulse Rate 03/28/23 0828 81     Resp 03/28/23 0828 14     Temp 03/28/23 0828 98.1 F (36.7 C)     Temp Source 03/28/23 0828 Oral     SpO2 03/28/23 0828 94 %     Weight 03/28/23 0827 197 lb (89.4 kg)     Height 03/28/23 0827 5\' 6"  (1.676 m)     Head Circumference --      Peak Flow --      Pain Score 03/28/23 0827 7     Pain Loc --      Pain Education --      Exclude from Growth Chart --    No data found.  Updated Vital Signs BP 135/79 (BP Location: Left Arm)   Pulse 81   Temp 98.1 F (36.7 C) (Oral)   Resp 14   Ht 5\' 6"  (1.676 m)   Wt 89.4 kg   SpO2 94%   BMI 31.80 kg/m   Visual Acuity Right Eye Distance:   Left Eye Distance:   Bilateral Distance:    Right Eye Near:   Left Eye Near:    Bilateral Near:     Physical Exam Alert awake and oriented.  No acute distress.  S1-S2 heard.  No tachycardia noted.  Bilateral air entry present.  No wheezing or rhonchi noted.  No hyperventilation noted.  Abdomen appears obese.  No distention noted.  Drain is present on the right upper quadrant.  Properly attached to the drainage collection bag.  Drainage collection bag approximately has 30 cc of bilious drainage.  The stoma was checked.  No erythema induration or leakage of the drainage noted.  The drainage  tube is tied with the suture material.  Otherwise abdomen is soft and nontender.  No  abnormal dullness tympany to percussion noted.  No peripheral edema noted.  UC Treatments / Results  Labs (all labs ordered are listed, but only abnormal results are displayed) Labs Reviewed  CBC WITH DIFFERENTIAL/PLATELET    EKG   Radiology No results found.  Procedures Procedures (including critical care time)  Medications Ordered in UC Medications - No data to display  Initial Impression / Assessment and Plan / UC Course  I have reviewed the triage vital signs and the nursing notes.  Pertinent labs & imaging results that were available during my care of the patient were reviewed by me and considered in my medical decision making (see chart for details).      Final Clinical Impressions(s) / UC Diagnoses   Final diagnoses:  Right upper quadrant abdominal pain     Discharge Instructions      Your blood work looks good.  No signs of infection.  Most of your symptoms are due to the exertion related to work.  Activity as tolerated.  Continue with your plan to see his surgeon on Wednesday next week.   ED Prescriptions   None    PDMP not reviewed this encounter.   Lura Em, MD 03/28/23 [phone]

## 2023-03-28 NOTE — ED Triage Notes (Signed)
Patient c/o right mid abdominal on the right side where the tube is to her gallbladder was placed.  Patient states the tube was place 2 weeks ago while she was in the hospital.  Patient has appointment with Surgery next Wed.  Patient states that she has had minimal drainage for the past 2 days.  Patient denies fevers.  Patient reports nausea.  Patient denies V/D.

## 2023-10-28 ENCOUNTER — Ambulatory Visit
Admission: EM | Admit: 2023-10-28 | Discharge: 2023-10-28 | Disposition: A | Discharge status: Home / Self Care | Attending: Emergency Medicine | Admitting: Emergency Medicine

## 2023-10-28 ENCOUNTER — Encounter: Payer: Self-pay | Admitting: Emergency Medicine

## 2023-10-28 DIAGNOSIS — Z20822 Contact with and (suspected) exposure to covid-19: Secondary | ICD-10-CM

## 2023-10-28 DIAGNOSIS — J4541 Moderate persistent asthma with (acute) exacerbation: Secondary | ICD-10-CM | POA: Diagnosis not present

## 2023-10-28 DIAGNOSIS — J0140 Acute pansinusitis, unspecified: Secondary | ICD-10-CM | POA: Diagnosis present

## 2023-10-28 LAB — RESP PANEL BY RT-PCR (FLU A&B, COVID) ARPGX2
Influenza A by PCR: NEGATIVE
Influenza B by PCR: NEGATIVE
SARS Coronavirus 2 by RT PCR: NEGATIVE

## 2023-10-28 MED ORDER — AMOXICILLIN-POT CLAVULANATE 875-125 MG PO TABS: 1.0000 | ORAL_TABLET | Freq: Two times a day (BID) | ORAL | 0 refills | Status: DC

## 2023-10-28 MED ORDER — PROMETHAZINE-DM 6.25-15 MG/5ML PO SYRP
5.0000 mL | ORAL_SOLUTION | Freq: Four times a day (QID) | ORAL | 0 refills | Status: DC | PRN
Start: 2023-10-28 — End: 2024-06-21

## 2023-10-28 MED ORDER — AEROCHAMBER MV MISC: 1 refills | Status: AC

## 2023-10-28 MED ORDER — DEXAMETHASONE SODIUM PHOSPHATE 10 MG/ML IJ SOLN
10.0000 mg | Freq: Once | INTRAMUSCULAR | Status: AC
  Administered 2023-10-28: 10 mg via INTRAMUSCULAR

## 2023-10-28 MED ORDER — ALBUTEROL SULFATE HFA 108 (90 BASE) MCG/ACT IN AERS: 2.0000 | INHALATION_SPRAY | RESPIRATORY_TRACT | 0 refills | Status: AC | PRN

## 2023-10-28 MED ORDER — IPRATROPIUM-ALBUTEROL 0.5-2.5 (3) MG/3ML IN SOLN
3.0000 mL | Freq: Once | RESPIRATORY_TRACT | Status: AC
  Administered 2023-10-28: 3 mL via RESPIRATORY_TRACT

## 2023-10-28 MED ORDER — ALBUTEROL SULFATE (2.5 MG/3ML) 0.083% IN NEBU
2.5000 mg | INHALATION_SOLUTION | Freq: Once | RESPIRATORY_TRACT | Status: AC
  Administered 2023-10-28: 2.5 mg via RESPIRATORY_TRACT

## 2023-10-28 MED ORDER — PREDNISONE 50 MG PO TABS: 50.0000 mg | ORAL_TABLET | Freq: Every day | ORAL | 0 refills | Status: DC

## 2023-10-28 NOTE — ED Provider Notes (Signed)
 HPI  SUBJECTIVE:  Denise Huffman is a 63 y.o. female who presents with coughing, wheezing, shortness of breath, dyspnea on exertion, chest soreness and tightness, sinus pain and pressure, upper dental pain, itchy, watery eyes, clear nasal congestion/extensive rhinorrhea the past 3 days.  She reports right ear pain as well.  No change in hearing, otorrhea.  States she feels as if she has fluid in both of her ears, especially the right 1.  No fevers, body aches.  She reports a headache from the cough, sore throat, postnasal drip.  No facial swelling.  She is unable to sleep at night because of the cough.  She has been using her albuterol with her spacer 2-3 times a day with improvement in her coughing, shortness of breath and chest tightness.  She normally uses it as needed.  She has run out of her albuterol.  She has also tried over-the-counter cough syrup and Flonase.  Flonase helps.  Symptoms are worse when she goes outside into the pollen.  No known COVID exposure.  She did not get the COVID-vaccine.  She has a past medical history of allergies, asthma with no admissions or recent steroid use.  She also has a history of demand ischemia/elevated troponin, hypertension, hyperlipidemia.  She is status postcholecystectomy.  No history of diabetes, chronic kidney disease.  PCP: [facility].   Past Medical History:  Diagnosis Date   Anxiety    Asthma    Cholecystitis    a. 02/2023 s/p perc drain.   Demand ischemia (HCC)    a. 02/2023 HsTrop to 404 in setting of admission for cholecystitis; b. 02/2023 Echo: EF 55-60%, no rwma, mod LVH, nl RV fxn, nl RVSP, triv MR.   Depression    GERD (gastroesophageal reflux disease)    Hyperlipidemia    Hypertension    Insomnia    Vertigo     Past Surgical History:  Procedure Laterality Date   ABDOMINAL HYSTERECTOMY     BACK SURGERY     IR PERC CHOLECYSTOSTOMY  03/08/2023    Family History  Problem Relation Age of Onset   Urinary tract infection Mother      Social History   Tobacco Use   Smoking status: Never   Smokeless tobacco: Never  Vaping Use   Vaping status: Never Used  Substance Use Topics   Alcohol use: No   Drug use: No    No current facility-administered medications for this encounter.  Current Outpatient Medications:    albuterol (VENTOLIN HFA) 108 (90 Base) MCG/ACT inhaler, Inhale 2 puffs into the lungs every 4 (four) hours as needed for wheezing or shortness of breath., Disp: 1 each, Rfl: 0   amoxicillin-clavulanate (AUGMENTIN) 875-125 MG tablet, Take 1 tablet by mouth every 12 (twelve) hours., Disp: 14 tablet, Rfl: 0   predniSONE (DELTASONE) 50 MG tablet, Take 1 tablet (50 mg total) by mouth daily with breakfast., Disp: 5 tablet, Rfl: 0   promethazine-dextromethorphan (PROMETHAZINE-DM) 6.25-15 MG/5ML syrup, Take 5 mLs by mouth 4 (four) times daily as needed for cough., Disp: 118 mL, Rfl: 0   Spacer/Aero-Holding Chambers (AEROCHAMBER MV) inhaler, Use as instructed, Disp: 1 each, Rfl: 1   amLODipine (NORVASC) 10 MG tablet, Take 1 tablet (10 mg total) by mouth daily. Skip the dose if systolic BP <140 mmHg, Disp: , Rfl:    ARIPiprazole (ABILIFY) 10 MG tablet, Take by mouth., Disp: , Rfl:    aspirin EC 81 MG tablet, Take 1 tablet (81 mg total) by  mouth daily. Swallow whole., Disp: 30 tablet, Rfl: 12   atorvastatin (LIPITOR) 20 MG tablet, Take 1 tablet by mouth daily., Disp: , Rfl:    buPROPion (WELLBUTRIN XL) 150 MG 24 hr tablet, Take 1 tablet by mouth every morning., Disp: , Rfl:    cetirizine (ZYRTEC) 10 MG tablet, Take 10 mg by mouth daily., Disp: , Rfl:    dicyclomine (BENTYL) 20 MG tablet, Take 20 mg by mouth at bedtime as needed for spasms., Disp: , Rfl:    fluticasone (FLONASE) 50 MCG/ACT nasal spray, Place 1 spray into both nostrils daily as needed for allergies., Disp: , Rfl:    fluticasone-salmeterol (ADVAIR) 100-50 MCG/ACT AEPB, Inhale 1 puff into the lungs 2 (two) times daily., Disp: , Rfl:    gabapentin  (NEURONTIN) 300 MG capsule, Take 300 mg by mouth 2 (two) times daily., Disp: , Rfl:    lisinopril (ZESTRIL) 20 MG tablet, Take 1 tablet (20 mg total) by mouth 2 (two) times daily. Skip the dose if systolic BP <130 mmHg, Disp: , Rfl:    traZODone (DESYREL) 100 MG tablet, Take 100 mg by mouth at bedtime., Disp: , Rfl:    Vitamin D, Ergocalciferol, (DRISDOL) 1.25 MG (50000 UNIT) CAPS capsule, Take 50,000 Units by mouth once a week., Disp: , Rfl:   Allergies  Allergen Reactions   Atorvastatin     Other Reaction(s): facial flushing     ROS  As noted in HPI.   Physical Exam  BP (!) 149/94 (BP Location: Left Arm)   Pulse 85   Temp 98.5 F (36.9 C) (Oral)   Resp 16   SpO2 92%   Constitutional: Well developed, well nourished, no acute distress Eyes: PERRL, EOMI, conjunctiva normal bilaterally HENT: Normocephalic, atraumatic,mucus membranes moist.  Clear nasal congestion.  Extensively erythematous, swollen turbinates.  Positive maxillary, frontal sinus tenderness.  Right TM normal.  Serous fluid behind left TM.  Normal oropharynx.  No postnasal drip. Neck: Positive cervical lymphadenopathy Respiratory: Poor air movement.  Faint scattered expiratory wheezing, no rales, no rhonchi.  Positive anterior, lateral chest wall tenderness Cardiovascular: Normal rate and rhythm, no murmurs, no gallops, no rubs GI: nondistended skin: No rash, skin intact Musculoskeletal: no deformities Neurologic: Alert & oriented x 3, CN III-XII grossly intact, no motor deficits, sensation grossly intact Psychiatric: Speech and behavior appropriate   ED Course   Medications  albuterol (PROVENTIL) (2.5 MG/3ML) 0.083% nebulizer solution 2.5 mg (2.5 mg Nebulization Given 10/28/23 1419)  ipratropium-albuterol (DUONEB) 0.5-2.5 (3) MG/3ML nebulizer solution 3 mL (3 mLs Nebulization Given 10/28/23 1419)  dexamethasone (DECADRON) injection 10 mg (10 mg Intramuscular Given 10/28/23 1417)    Orders Placed This Encounter   Procedures   Resp Panel by RT-PCR (Flu A&B, Covid) Anterior Nasal Swab    Standing Status:   Standing    Number of Occurrences:   1   Results for orders placed or performed during the hospital encounter of 10/28/23 (from the past 24 hours)  Resp Panel by RT-PCR (Flu A&B, Covid) Anterior Nasal Swab     Status: None   Collection Time: 10/28/23  2:23 PM   Specimen: Anterior Nasal Swab  Result Value Ref Range   SARS Coronavirus 2 by RT PCR NEGATIVE NEGATIVE   Influenza A by PCR NEGATIVE NEGATIVE   Influenza B by PCR NEGATIVE NEGATIVE   No results found.  ED Clinical Impression  1. Moderate persistent asthma with acute exacerbation   2. Acute non-recurrent pansinusitis   3. Lab test  negative for COVID-19 virus      ED Assessment/Plan     O2 sat of 90% noted.  Previous O2 sat 94%.  Presentation consistent with either an upper respiratory infection versus allergies with an accompanying asthma exacerbation/acute pansinusitis.  Will check COVID, flu.  Will extend the treatment window out for Tamiflu if influenza positive because of history of asthma.  Will give 5/0.5 mg of albuterol/ipratropium, 10 mg dexamethasone and reevaluate, repeat O2 sat.  Reevaluation, patient states that she feels much better.  Repeat O2 sat 92 to 93% on room air on my exam.  Loud expiratory wheezing throughout all lung fields, increased air movement.  Sending home with regularly scheduled albuterol inhaler with a spacer for 4 days, then as needed thereafter, prednisone 50 mg for 5 days, Flonase, saline nasal rogation, Mucinex, and if that does not work, then an antihistamine such as Advertising account executive.  Promethazine DM for cough.  Augmentin for sinusitis.  Advised patient to wait an hour or 2 before filling medications because if her COVID or flu come back positive, we will be changing management.  She has follow-up with her PCP in 2 days.  COVID, influenza negative.  Will have staff contact patient and  notify her of negative lab results and to go ahead and pick up the antibiotics, continue plan as outlined above.  Discussed labs, MDM, treatment plan, and plan for follow-up with patient Discussed sn/sx that should prompt return to the ED. patient agrees with plan.   Meds ordered this encounter  Medications   albuterol (PROVENTIL) (2.5 MG/3ML) 0.083% nebulizer solution 2.5 mg   ipratropium-albuterol (DUONEB) 0.5-2.5 (3) MG/3ML nebulizer solution 3 mL   dexamethasone (DECADRON) injection 10 mg   amoxicillin-clavulanate (AUGMENTIN) 875-125 MG tablet    Sig: Take 1 tablet by mouth every 12 (twelve) hours.    Dispense:  14 tablet    Refill:  0   predniSONE (DELTASONE) 50 MG tablet    Sig: Take 1 tablet (50 mg total) by mouth daily with breakfast.    Dispense:  5 tablet    Refill:  0   Spacer/Aero-Holding Chambers (AEROCHAMBER MV) inhaler    Sig: Use as instructed    Dispense:  1 each    Refill:  1   albuterol (VENTOLIN HFA) 108 (90 Base) MCG/ACT inhaler    Sig: Inhale 2 puffs into the lungs every 4 (four) hours as needed for wheezing or shortness of breath.    Dispense:  1 each    Refill:  0   promethazine-dextromethorphan (PROMETHAZINE-DM) 6.25-15 MG/5ML syrup    Sig: Take 5 mLs by mouth 4 (four) times daily as needed for cough.    Dispense:  118 mL    Refill:  0      *This clinic note was created using Scientist, clinical (histocompatibility and immunogenetics). Therefore, there may be occasional mistakes despite careful proofreading. ?    Domenick Gong, MD 10/28/23 [phone]

## 2023-10-28 NOTE — Discharge Instructions (Addendum)
 We will contact you if your COVID or flu come back positive and we will treat you accordingly.  I would wait about an hour to go and fill your medications.  Take two puffs from your albuterol inhaler with your spacer every 4 hours for 2 days, then every 6 hours for 2 days, then as needed. You can back off if you start to improve  sooner. Finish the steroids unless your doctor tells you to stop. Finish the antibiotics, even if you feel better. You may start the steroids tomorrow, as you have already taken today's dose. Take tylenol 1 gram combined with  600 mg of motrin up to 3-4 times a day as needed for pain. Make sure you drink extra fluids. Return to the ER if you get worse, have a fever >100.4, or any other concerns.   Flonase, start saline nasal irrigation with a NeilMed sinus rinse and distilled water as often as you want.  Try Mucinex first, if this is not helping, then switch to an antihistamine such as Claritin, Allegra, or Zyrtec.  If the spacer is too expensive at the pharmacy, you can get an AeroChamber Z-Stat off of [employer] for about $10-$15.  Go to www.goodrx.com  or www.costplusdrugs.com to look up your medications. This will give you a list of where you can find your prescriptions at the most affordable prices. Or ask the pharmacist what the cash price is, or if they have any other discount programs available to help make your medication more affordable. This can be less expensive than what you would pay with insurance.

## 2023-10-28 NOTE — ED Triage Notes (Addendum)
 Pt presents with cough, chest congestion, headache and wheezing x 3 days. Pt has used her nebulizer and inhaler today.

## 2023-12-15 ENCOUNTER — Ambulatory Visit
Admission: EM | Admit: 2023-12-15 | Discharge: 2023-12-15 | Disposition: A | Discharge status: Home / Self Care | Attending: Physician Assistant | Admitting: Physician Assistant

## 2023-12-15 DIAGNOSIS — R600 Localized edema: Secondary | ICD-10-CM

## 2023-12-15 DIAGNOSIS — L255 Unspecified contact dermatitis due to plants, except food: Secondary | ICD-10-CM | POA: Diagnosis not present

## 2023-12-15 MED ORDER — DEXAMETHASONE SODIUM PHOSPHATE 10 MG/ML IJ SOLN
10.0000 mg | Freq: Once | INTRAMUSCULAR | Status: AC
  Administered 2023-12-15: 10 mg via INTRAMUSCULAR

## 2023-12-15 MED ORDER — PREDNISONE 10 MG PO TABS: ORAL_TABLET | ORAL | 0 refills | Status: DC

## 2023-12-15 MED ORDER — TRIAMCINOLONE ACETONIDE 0.5 % EX OINT: 1.0000 | TOPICAL_OINTMENT | Freq: Two times a day (BID) | CUTANEOUS | 1 refills | Status: DC

## 2023-12-15 NOTE — ED Triage Notes (Signed)
 Rash on both calf and bilateral leg swelling x 2 days. Patient states that legs swell but they have gotten worse.
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
## 2023-12-15 NOTE — Discharge Instructions (Addendum)
-  Rash is consistent with poison ivy. Begin corticosteroid tomorrow. Take Benadryl as needed for itching -Cool compresses -Elevate extremities and wear compression stockings. F/u with PCP about leg swelling

## 2024-06-21 ENCOUNTER — Encounter: Payer: Self-pay | Admitting: Emergency Medicine

## 2024-06-21 ENCOUNTER — Ambulatory Visit
Admission: EM | Admit: 2024-06-21 | Discharge: 2024-06-21 | Disposition: A | Discharge status: Home / Self Care | Attending: Emergency Medicine | Admitting: Emergency Medicine

## 2024-06-21 DIAGNOSIS — S161XXA Strain of muscle, fascia and tendon at neck level, initial encounter: Secondary | ICD-10-CM

## 2024-06-21 MED ORDER — METHYLPREDNISOLONE 4 MG PO TBPK: ORAL_TABLET | ORAL | 0 refills | Status: AC

## 2024-06-21 MED ORDER — BACLOFEN 10 MG PO TABS: 10.0000 mg | ORAL_TABLET | Freq: Three times a day (TID) | ORAL | 0 refills | Status: AC

## 2024-06-21 MED ORDER — DEXAMETHASONE SOD PHOSPHATE PF 10 MG/ML IJ SOLN
10.0000 mg | Freq: Once | INTRAMUSCULAR | Status: AC
  Administered 2024-06-21: 10 mg via INTRAMUSCULAR

## 2024-06-21 NOTE — ED Triage Notes (Signed)
 Pt presents with a headache, neck and shoulder pain x 2 days. Pt has taken tramadol and gabapentin  with no relief.

## 2024-06-21 NOTE — ED Provider Notes (Signed)
 [facility]    CSN: [id number] Arrival date & time: 06/21/24  0804      History   Chief Complaint Chief Complaint  Patient presents with   Headache   Neck Pain   Shoulder Pain    HPI Denise Huffman is a 63 y.o. female.   HPI  63 year old female with past medical history significant for asthma, GERD, hypertension, hyperlipidemia, anxiety, depression, vertigo, demand ischemia, and chronic pain presents for evaluation of pain in her neck and shoulders that started 2 days ago on the left side and now is on both sides.  She reports that she woke up this way.  No heavy lifting or injury.  No fall.  She denies any numbness, tingling, weakness in her extremities.  She is prescribed tramadol and gabapentin  and reports that neither is helping her symptoms.  Past Medical History:  Diagnosis Date   Anxiety    Asthma    Cholecystitis    a. 02/2023 s/p perc drain.   Demand ischemia (HCC)    a. 02/2023 HsTrop to 404 in setting of admission for cholecystitis; b. 02/2023 Echo: EF 55-60%, no rwma, mod LVH, nl RV fxn, nl RVSP, triv MR.   Depression    GERD (gastroesophageal reflux disease)    Hyperlipidemia    Hypertension    Insomnia    Vertigo     Patient Active Problem List   Diagnosis Date Noted   Acute cholecystitis 03/07/2023   Elevated troponin 03/07/2023   Hyperlipidemia 03/07/2023   Leukocytosis 03/07/2023   GERD (gastroesophageal reflux disease) 03/07/2023   Depression 03/07/2023   Pyuria 03/07/2023   Hypertension 04/17/2018   Pain in both lower extremities 04/01/2018   Bilateral lower extremity edema 04/01/2018    Past Surgical History:  Procedure Laterality Date   ABDOMINAL HYSTERECTOMY     BACK SURGERY     IR PERC CHOLECYSTOSTOMY  03/08/2023    OB History   No obstetric history on file.      Home Medications    Prior to Admission medications   Medication Sig Start Date End Date Taking? Authorizing Provider  baclofen  (LIORESAL ) 10 MG tablet Take 1  tablet (10 mg total) by mouth 3 (three) times daily. 06/21/24  Yes Bernardino Ditch, NP  methylPREDNISolone  (MEDROL  DOSEPAK) 4 MG TBPK tablet Take according to the package insert. 06/21/24  Yes Bernardino Ditch, NP  albuterol  (VENTOLIN  HFA) 108 (90 Base) MCG/ACT inhaler Inhale 2 puffs into the lungs every 4 (four) hours as needed for wheezing or shortness of breath. 10/28/23   Van Knee, MD  amLODipine  (NORVASC ) 10 MG tablet Take 1 tablet (10 mg total) by mouth daily. Skip the dose if systolic BP <140 mmHg 03/11/23   Von Bellis, MD  ARIPiprazole  (ABILIFY ) 10 MG tablet Take by mouth. 02/06/23   [provider]  aspirin  EC 81 MG tablet Take 1 tablet (81 mg total) by mouth daily. Swallow whole. 03/12/23   Von Bellis, MD  atorvastatin  (LIPITOR) 20 MG tablet Take 1 tablet by mouth daily. 05/14/22 12/15/23  [provider]  buPROPion  (WELLBUTRIN  XL) 150 MG 24 hr tablet Take 1 tablet by mouth every morning. 09/27/20 12/15/23  [provider]  cetirizine (ZYRTEC) 10 MG tablet Take 10 mg by mouth daily.    [provider]  dicyclomine (BENTYL) 20 MG tablet Take 20 mg by mouth at bedtime as needed for spasms.    [provider]  fluticasone (FLONASE) 50 MCG/ACT nasal spray Place 1 spray  into both nostrils daily as needed for allergies. 08/27/22 08/27/23  [provider]  fluticasone-salmeterol (ADVAIR) 100-50 MCG/ACT AEPB Inhale 1 puff into the lungs 2 (two) times daily. 02/06/22   [provider]  gabapentin  (NEURONTIN ) 300 MG capsule Take 300 mg by mouth 2 (two) times daily.    [provider]  lisinopril  (ZESTRIL ) 20 MG tablet Take 1 tablet (20 mg total) by mouth 2 (two) times daily. Skip the dose if systolic BP <130 mmHg 03/11/23   Von Bellis, MD  Spacer/Aero-Holding [place] (AEROCHAMBER MV) inhaler Use as instructed 10/28/23   Van Knee, MD  traZODone  (DESYREL ) 100 MG tablet Take 100 mg by mouth at bedtime.    [provider]  Vitamin D , Ergocalciferol , (DRISDOL) 1.25 MG (50000 UNIT) CAPS capsule Take 50,000 Units by mouth once a week. 01/27/23   [provider]  ipratropium (ATROVENT) 0.06 % nasal spray Place 2 sprays into both nostrils 4 (four) times daily.  12/11/19  [provider]    Family History Family History  Problem Relation Age of Onset   Urinary tract infection Mother     Social History Social History   Tobacco Use   Smoking status: Never   Smokeless tobacco: Never  Vaping Use   Vaping status: Never Used  Substance Use Topics   Alcohol use: No   Drug use: No     Allergies   Atorvastatin    Review of Systems Review of Systems  Musculoskeletal:  Positive for back pain and neck pain.  Neurological:  Positive for headaches.     Physical Exam Triage Vital Signs ED Triage Vitals  Encounter Vitals Group     BP      Girls Systolic BP Percentile      Girls Diastolic BP Percentile      Boys Systolic BP Percentile      Boys Diastolic BP Percentile      Pulse      Resp      Temp      Temp src      SpO2      Weight      Height      Head Circumference      Peak Flow      Pain Score      Pain Loc      Pain Education      Exclude from Growth Chart    No data found.  Updated Vital Signs BP (!) 172/78 (BP Location: Right Arm)   Pulse 74   Temp 97.8 F (36.6 C) (Oral)   Resp 16   Wt 217 lb (98.4 kg)   SpO2 94%   BMI 35.02 kg/m   Visual Acuity Right Eye Distance:   Left Eye Distance:   Bilateral Distance:    Right Eye Near:   Left Eye Near:    Bilateral Near:     Physical Exam Vitals and nursing note reviewed.  Constitutional:      Appearance: Normal appearance. She is not ill-appearing.  HENT:     Head: Normocephalic and atraumatic.  Musculoskeletal:        General: Tenderness present. No signs of injury.  Skin:    General: Skin is warm and dry.     Capillary Refill: Capillary refill takes less than 2 seconds.     Findings: No bruising  or erythema.  Neurological:     General: No focal deficit present.     Mental Status: She is alert and oriented  to person, place, and time.     Motor: No weakness.      UC Treatments / Results  Labs (all labs ordered are listed, but only abnormal results are displayed) Labs Reviewed - No data to display  EKG   Radiology No results found.  Procedures Procedures (including critical care time)  Medications Ordered in UC Medications  dexamethasone  (DECADRON ) injection 10 mg (has no administration in time range)    Initial Impression / Assessment and Plan / UC Course  I have reviewed the triage vital signs and the nursing notes.  Pertinent labs & imaging results that were available during my care of the patient were reviewed by me and considered in my medical decision making (see chart for details).   Patient is a nontoxic-appearing 63 year old female presenting for evaluation of musculoskeletal complaints as outlined in HPI above.  She reports that yesterday morning she woke up with pain in her left shoulder and neck that radiated up the left side of her head.  Today it is on both sides of her upper back, neck, and back of her head.  She denies any injuries or falls.  No numbness, tingling, weakness.  She has no midline spinous process tenderness or step-off in her cervical or upper thoracic spine.  She has significant tension in the paraspinous muscle group and upper trapezius bilaterally.  Bilateral grips and upper extremity strength are 5/5.  She has a history of chronic pain and is already prescribed gabapentin  and tramadol.  I advised her that the only thing I can offer her here was an injection of steroids followed by a low-dose steroid taper and nonsedating muscle relaxers with home physical therapy.  If her symptoms do not improve she needs to follow-up with her PCP to discuss a referral to physical therapy.   Final Clinical Impressions(s) / UC Diagnoses   Final diagnoses:   Cervical strain, acute, initial encounter     Discharge Instructions      Take the Medrol  Dosepak according to the package instructions.  You will start this tomorrow morning.  Take the baclofen , 10 mg every 8 hours, on a schedule for the next 48 hours and then as needed.  Apply moist heat to your neck for 30 minutes at a time 2-3 times a day to improve blood flow to the area and help remove the lactic acid causing the spasm.  Follow the neck exercises given at discharge.  Return for reevaluation for any new or worsening symptoms.      ED Prescriptions     Medication Sig Dispense Auth. Provider   methylPREDNISolone  (MEDROL  DOSEPAK) 4 MG TBPK tablet Take according to the package insert. 1 each Bernardino Ditch, NP   baclofen  (LIORESAL ) 10 MG tablet Take 1 tablet (10 mg total) by mouth 3 (three) times daily. 30 each Bernardino Ditch, NP      I have reviewed the PDMP during this encounter.   Bernardino Ditch, NP 06/21/24 [phone]

## 2024-06-21 NOTE — Discharge Instructions (Addendum)
 Take the Medrol  Dosepak according to the package instructions.  You will start this tomorrow morning.  Take the baclofen , 10 mg every 8 hours, on a schedule for the next 48 hours and then as needed.  Apply moist heat to your neck for 30 minutes at a time 2-3 times a day to improve blood flow to the area and help remove the lactic acid causing the spasm.  Follow the neck exercises given at discharge.  Return for reevaluation for any new or worsening symptoms.
